# Patient Record
Sex: Male | Born: 1975
Health system: Southern US, Community
[De-identification: ages and names within clinical notes are randomized; demographics above are authoritative.]

## PROBLEM LIST (undated history)

## (undated) DIAGNOSIS — Z8739 Personal history of other diseases of the musculoskeletal system and connective tissue: Secondary | ICD-10-CM

## (undated) DIAGNOSIS — M109 Gout, unspecified: Secondary | ICD-10-CM

## (undated) DIAGNOSIS — K219 Gastro-esophageal reflux disease without esophagitis: Secondary | ICD-10-CM

## (undated) HISTORY — DX: Personal history of other diseases of the musculoskeletal system and connective tissue: Z87.39

---

## 2015-07-06 LAB — CBC AND DIFFERENTIAL
HCT: 43 % (ref 41–53)
Hemoglobin: 14.7 g/dL (ref 13.5–17.5)
Neutrophils Absolute: 3 /uL
Platelets: 269 10*3/uL (ref 150–399)
WBC: 5.7 10^3/mL

## 2015-07-06 LAB — PSA: PROSTATE SPECIFIC AG, SERUM: 0.5

## 2015-07-06 LAB — BASIC METABOLIC PANEL
BUN: 13 mg/dL (ref 4–21)
CREATININE: 1.1 mg/dL (ref 0.6–1.3)
GLUCOSE: 84 mg/dL
POTASSIUM: 4.1 mmol/L (ref 3.4–5.3)
SODIUM: 138 mmol/L (ref 137–147)

## 2015-07-06 LAB — LIPID PANEL
CHOLESTEROL: 196 mg/dL (ref 0–200)
HDL: 52 mg/dL (ref 35–70)
LDL CALC: 123 mg/dL
TRIGLYCERIDES: 105 mg/dL (ref 40–160)

## 2015-07-06 LAB — HEPATIC FUNCTION PANEL
ALT: 92 U/L — AB (ref 10–40)
AST: 45 U/L — AB (ref 14–40)

## 2015-07-06 LAB — TSH: TSH: 3.59 u[IU]/mL (ref 0.41–5.90)

## 2015-08-02 DIAGNOSIS — J069 Acute upper respiratory infection, unspecified: Secondary | ICD-10-CM | POA: Diagnosis not present

## 2015-09-05 ENCOUNTER — Ambulatory Visit (INDEPENDENT_AMBULATORY_CARE_PROVIDER_SITE_OTHER): Payer: BLUE CROSS/BLUE SHIELD | Admitting: Family Medicine

## 2015-09-05 ENCOUNTER — Ambulatory Visit (INDEPENDENT_AMBULATORY_CARE_PROVIDER_SITE_OTHER): Payer: BLUE CROSS/BLUE SHIELD

## 2015-09-05 ENCOUNTER — Encounter: Payer: Self-pay | Admitting: Family Medicine

## 2015-09-05 VITALS — BP 119/81 | HR 84 | Temp 98.8°F | Ht 75.0 in | Wt 236.0 lb

## 2015-09-05 DIAGNOSIS — Z8781 Personal history of (healed) traumatic fracture: Secondary | ICD-10-CM | POA: Insufficient documentation

## 2015-09-05 DIAGNOSIS — Z8739 Personal history of other diseases of the musculoskeletal system and connective tissue: Secondary | ICD-10-CM | POA: Insufficient documentation

## 2015-09-05 DIAGNOSIS — R05 Cough: Secondary | ICD-10-CM

## 2015-09-05 DIAGNOSIS — R053 Chronic cough: Secondary | ICD-10-CM | POA: Insufficient documentation

## 2015-09-05 DIAGNOSIS — R740 Nonspecific elevation of levels of transaminase and lactic acid dehydrogenase [LDH]: Secondary | ICD-10-CM

## 2015-09-05 DIAGNOSIS — R7401 Elevation of levels of liver transaminase levels: Secondary | ICD-10-CM | POA: Insufficient documentation

## 2015-09-05 DIAGNOSIS — J309 Allergic rhinitis, unspecified: Secondary | ICD-10-CM | POA: Insufficient documentation

## 2015-09-05 MED ORDER — OMEPRAZOLE 40 MG PO CPDR: 40.0000 mg | DELAYED_RELEASE_CAPSULE | Freq: Every day | ORAL | Status: DC

## 2015-09-05 NOTE — Progress Notes (Signed)
       William Marshall is a 40 y.o. male who [facility] today for establish care and discuss chronic cough. Patient has a several year history of chronic bothersome cough. He's been treated presumptively for postnasal drainage and silent acid reflux and allergies all of which really has never helped much. He currently takes Zyrtec daily. He denies any fevers chills vomiting or diarrhea. The cough is bothersome persistent and nonproductive.  Patient notes he had labs done at work revealing mild transaminitis in April 2017.   Past Medical History  Diagnosis Date  . History of osteomyelitis as a child    History reviewed. No pertinent past surgical history. Social History  Substance Use Topics  . Smoking status: Never Smoker   . Smokeless tobacco: Not on file  . Alcohol Use: 0.0 oz/week    0 Standard drinks or equivalent per week   family history includes Diabetes in his mother; Hypertension in his mother; Kidney disease in his maternal grandmother.  ROS as above: No headache, visual changes, nausea, vomiting, diarrhea, constipation, dizziness, abdominal pain, skin rash, fevers, chills, night sweats, weight loss, swollen lymph nodes, body aches, joint swelling, muscle aches, chest pain, shortness of breath, mood changes, visual or auditory hallucinations.   Medications: Current Outpatient Prescriptions  Medication Sig Dispense Refill  . omeprazole (PRILOSEC) 40 MG capsule Take 1 capsule (40 mg total) by mouth daily. 30 capsule 3   No current facility-administered medications for this visit.   No Known Allergies   Exam:  BP 119/81 mmHg  Pulse 84  Temp(Src) 98.8 F (37.1 C) (Oral)  Ht 6\' 3"  (1.905 m)  Wt 236 lb (107.049 kg)  BMI 29.50 kg/m2  SpO2 97% Gen: Well NAD HEENT: EOMI,  MMM Posterior pharynx with cobblestoning Lungs: Normal work of breathing.  Coarse breath sounds with prolonged expiratory phase and some wheezing Heart: RRR no MRG Abd: NABS, Soft. Nondistended, Nontender Exts: Brisk capillary refill, warm and well perfused.   No results found for this or any previous visit (from the past 24 hour(s)). No results found.    Assessment and Plan: 40 y.o. male with  1) chronic cough: Unclear etiology. I'm quite concerned for asthma versus acid reflux. Plan to obtain spirometry/lung function testing in the near future. Additionally we'll treat empirically with omeprazole. Obtain chest x-ray. 2) transaminitis: Will repeat metabolic panel today.  Discussed warning signs or symptoms. Please see discharge instructions. Patient expresses understanding.

## 2015-09-05 NOTE — Patient Instructions (Signed)
Thank you for coming in today. Get labs today.  Get xray today.  Start omeprazole today as well. Take it daily for at least 1 month.   Schedule a lung function test (spirometry) with me soon.   Fatty Liver Fatty liver, also called hepatic steatosis or steatohepatitis, is a condition in which too much fat has built up in your liver cells. The liver removes harmful substances from your bloodstream. It produces fluids your body needs. It also helps your body use and store energy from the food you eat. In many cases, fatty liver does not cause symptoms or problems. It is often diagnosed when tests are being done for other reasons. However, over time, fatty liver can cause inflammation that may lead to more serious liver problems, such as scarring of the liver (cirrhosis). CAUSES  Causes of fatty liver may include:   Drinking too much alcohol.  Poor nutrition.  Obesity.  Cushing syndrome.  Diabetes.  Hyperlipidemia.  Pregnancy.  Certain drugs.  Poisons.  Some viral infections. RISK FACTORS You may be more likely to develop fatty liver if you:  Abuse alcohol.  Are pregnant.  Are overweight.  Have diabetes.  Have hepatitis.  Have a high triglyceride level.  SIGNS AND SYMPTOMS  Fatty liver often does not cause any symptoms. In cases where symptoms develop, they can include:  Fatigue.  Weakness.  Weight loss.  Confusion.   Abdominal pain.  Yellowing of your skin and the white parts of your eyes (jaundice).  Nausea and vomiting. DIAGNOSIS  Fatty liver may be diagnosed by:   Physical exam and medical history.  Blood tests.  Imaging tests, such as an ultrasound, CT scan, or MRI.  Liver biopsy. A small sample of liver tissue is removed using a needle. The sample is then looked at under a microscope. TREATMENT  Fatty liver is often caused by other health conditions. Treatment for fatty liver may involve medicines and lifestyle changes to manage conditions  such as:   Alcoholism.  High cholesterol.  Diabetes.  Being overweight or obese.  HOME CARE INSTRUCTIONS  Eat a healthy diet as directed by your health care provider.  Exercise regularly. This can help you lose weight and control your cholesterol and diabetes. Talk to your health care provider about an exercise plan and which activities are best for you.  Do not drink alcohol.   Take medicines only as directed by your health care provider. SEEK MEDICAL CARE IF: You have difficulty controlling your:  Blood sugar.  Cholesterol.  Alcohol consumption. SEEK IMMEDIATE MEDICAL CARE IF:  You have abdominal pain.  You have jaundice.  You have nausea and vomiting.   This information is not intended to replace advice given to you by your health care provider. Make sure you discuss any questions you have with your health care provider.   Document Released: 04/12/2005 Document Revised: 03/18/2014 Document Reviewed: 07/07/2013 Elsevier Interactive Patient Education [employer] Elsevier Inc.

## 2015-09-05 NOTE — Addendum Note (Signed)
Addended by: Minna AntisBRIGHAM, EBONY T on: 09/05/2015 05:10 PM   Modules accepted: Orders

## 2015-09-06 ENCOUNTER — Encounter: Payer: Self-pay | Admitting: Family Medicine

## 2015-09-06 LAB — COMPREHENSIVE METABOLIC PANEL
ALK PHOS: 68 U/L (ref 40–115)
ALT: 97 U/L — AB (ref 9–46)
AST: 55 U/L — ABNORMAL HIGH (ref 10–40)
Albumin: 4.7 g/dL (ref 3.6–5.1)
BILIRUBIN TOTAL: 1.3 mg/dL — AB (ref 0.2–1.2)
BUN: 12 mg/dL (ref 7–25)
CALCIUM: 9.4 mg/dL (ref 8.6–10.3)
CO2: 27 mmol/L (ref 20–31)
CREATININE: 1.19 mg/dL (ref 0.60–1.35)
Chloride: 103 mmol/L (ref 98–110)
GLUCOSE: 89 mg/dL (ref 65–99)
Potassium: 3.9 mmol/L (ref 3.5–5.3)
Sodium: 141 mmol/L (ref 135–146)
TOTAL PROTEIN: 7.2 g/dL (ref 6.1–8.1)

## 2015-09-06 NOTE — Progress Notes (Signed)
Quick Note:  Lung xray normal. ______

## 2015-09-06 NOTE — Progress Notes (Signed)
Quick Note:  The liver enzymes are still elevated. We will get some labs to test for viral hepatitis and get a liver ultrasound that will help us figure out why this is the case.   ______

## 2015-09-06 NOTE — Addendum Note (Signed)
Addended by: Rodolph BongOREY, Caitlynne Harbeck S on: 09/06/2015 07:32 AM   Modules accepted: Orders

## 2015-09-11 ENCOUNTER — Ambulatory Visit (INDEPENDENT_AMBULATORY_CARE_PROVIDER_SITE_OTHER): Payer: BLUE CROSS/BLUE SHIELD

## 2015-09-11 DIAGNOSIS — R7401 Elevation of levels of liver transaminase levels: Secondary | ICD-10-CM

## 2015-09-11 DIAGNOSIS — R740 Nonspecific elevation of levels of transaminase and lactic acid dehydrogenase [LDH]: Secondary | ICD-10-CM

## 2015-09-11 NOTE — Progress Notes (Signed)
Quick Note:  The ultrasound shows fatty liver disease like we suspected. Work on diet and weight loss. ______

## 2015-09-12 LAB — HEPATITIS C ANTIBODY: HCV AB: NEGATIVE

## 2015-09-12 LAB — HEPATITIS B CORE ANTIBODY, TOTAL: HEP B C TOTAL AB: NONREACTIVE

## 2015-09-12 LAB — HEPATITIS B SURFACE ANTIBODY, QUANTITATIVE: HEPATITIS B-POST: 121 m[IU]/mL

## 2015-09-12 LAB — HEPATITIS B SURFACE ANTIGEN: Hepatitis B Surface Ag: NEGATIVE

## 2015-09-22 ENCOUNTER — Ambulatory Visit (INDEPENDENT_AMBULATORY_CARE_PROVIDER_SITE_OTHER): Payer: BLUE CROSS/BLUE SHIELD | Admitting: Family Medicine

## 2015-09-22 VITALS — BP 141/84 | HR 55 | Wt 242.0 lb

## 2015-09-22 DIAGNOSIS — R05 Cough: Secondary | ICD-10-CM

## 2015-09-22 DIAGNOSIS — R053 Chronic cough: Secondary | ICD-10-CM

## 2015-09-22 MED ORDER — ALBUTEROL SULFATE (2.5 MG/3ML) 0.083% IN NEBU
2.5000 mg | INHALATION_SOLUTION | Freq: Once | RESPIRATORY_TRACT | Status: AC
  Administered 2015-09-22: 2.5 mg via RESPIRATORY_TRACT

## 2015-09-22 NOTE — Progress Notes (Signed)
       Caryl BisSpencer Walther is a 40 y.o. male who [name] today for follow-up cough. She notes chronic cough. He was seen recently and started on omeprazole for possible reflux. He notes this is helped a bit. Additionally he was recommended to restart nasal steroids. He's use these very inconsistently. He denies significant wheezing or shortness of breath. He was seen today for spirometry as well.   Past Medical History  Diagnosis Date  . History of osteomyelitis as a child    No past surgical history on file. Social History  Substance Use Topics  . Smoking status: Never Smoker   . Smokeless tobacco: Not on file  . Alcohol Use: 0.0 oz/week    0 Standard drinks or equivalent per week   family history includes Diabetes in his mother; Hypertension in his mother; Kidney disease in his maternal grandmother.  ROS as above:  Medications: Current Outpatient Prescriptions  Medication Sig Dispense Refill  . omeprazole (PRILOSEC) 40 MG capsule Take 1 capsule (40 mg total) by mouth daily. 30 capsule 3   No current facility-administered medications for this visit.   No Known Allergies   Exam:  BP 141/84 mmHg  Pulse 55  Wt 242 lb (109.77 kg) Gen: Well NAD  PFT INTERPRETATION  FEV1/FVC >70 *AND*  FEV1 >80% predicted  = NORMAL SPIROMETRY NORMAL? yes  1. Valid study? yes 2. Flow-Volume Loop: normal 3. FEV1/FVC: 95% (normal)  >70 = Normal  <70 = Obstructive    = COPD no 3. Severity of Obstruction ~ Post-Bronchodilator FEV1: NA  FEV1 80+ = GOLD Stage I = Mild  FEV1 50-79 = GOLD Stage II = Moderate  FEV1 30-49 = GOLD Stage III = Severe  FEV1 <30 = GOLD Stage IV = Very Severe 5. Bronchodilator Challenge  Increase FEV1 or FVC by 200+mL? no *AND*  Increased same FEV1 or FVC by 12+%? no  Reversible? no 6. FVC <80% = Restrictive no 7. Lung Volumes  TLC, VC,  RV Normal = 80-120%  TLC <80% = Restrictive  TLC >120% = Hyperinflation  RV >120% = Air Trapping  RV <80% = RLD or OLD  VC <80% = RLD or OLD 8. Diffusion Capacity - refer to Pulm needed? No If suspect interstitial lung disease           No results found for this or any previous visit (from the past 24 hour(s)). No results found.    Assessment and Plan: 40 y.o. male with chronic cough with mildly abnormal but within normal range spirometry results.  I suspect the cause of his chronic cough is more related to silent reflux and postnasal drainage then a pulmonology etiology.  Plan to continue Flonase nasal spray, refer to ear nose and throat and return in a few months. If not better will refer to pulmonology for further testing and evaluation.  Discussed warning signs or symptoms. Please see discharge instructions. Patient expresses understanding.

## 2015-09-22 NOTE — Patient Instructions (Signed)
Thank you for coming in today. Use flonase.  Follow up with ENT.  Return in a few months if not better.  Call or go to the emergency room if you get worse, have trouble breathing, have chest pains, or palpitations.

## 2017-03-17 ENCOUNTER — Encounter: Payer: Self-pay | Admitting: Family Medicine

## 2017-03-17 ENCOUNTER — Ambulatory Visit: Payer: BLUE CROSS/BLUE SHIELD | Admitting: Family Medicine

## 2017-03-17 VITALS — BP 150/91 | HR 79 | Ht 75.0 in | Wt 255.0 lb

## 2017-03-17 DIAGNOSIS — R05 Cough: Secondary | ICD-10-CM

## 2017-03-17 DIAGNOSIS — R059 Cough, unspecified: Secondary | ICD-10-CM

## 2017-03-17 DIAGNOSIS — Z23 Encounter for immunization: Secondary | ICD-10-CM | POA: Diagnosis not present

## 2017-03-17 MED ORDER — AZITHROMYCIN 250 MG PO TABS: 250.0000 mg | ORAL_TABLET | Freq: Every day | ORAL | 0 refills | Status: DC

## 2017-03-17 MED ORDER — PREDNISONE 10 MG PO TABS: 30.0000 mg | ORAL_TABLET | Freq: Every day | ORAL | 0 refills | Status: DC

## 2017-03-17 NOTE — Progress Notes (Signed)
William Marshall is a 42 y.o. male who [name] today for sore neck and congestion.  Patient with 4 week history of cough, nasal running, sore throat, and green mucous production. Patient reports chronic cough at baseline, but it has been worse the last 4 weeks. Patient denies fever, chills, shortness of breath. URI symptoms have improved somewhat, but he still endorses productive cough and congestion. Patient now endorsing sore left ear and mandibular region. He states that this began 3-4 days ago. Patient not endorsing any pain with chewing, shaving, and biting down. Patient does endorse some ear fullness/congestion. Again, denies fever and chills.   Otherwise, patient doing well. No new complaints today.    Past Medical History:  Diagnosis Date  . History of osteomyelitis as a child    No past surgical history on file. Social History   Tobacco Use  . Smoking status: Never Smoker  . Smokeless tobacco: Never Used  Substance Use Topics  . Alcohol use: Yes    Alcohol/week: 0.0 oz   family history includes Diabetes in his mother; Hypertension in his mother; Kidney disease in his maternal grandmother.  ROS as above:  Medications: Current Outpatient Medications  Medication Sig Dispense Refill  . omeprazole (PRILOSEC) 40 MG capsule Take 1 capsule (40 mg total) by mouth daily. 30 capsule 3  . azithromycin (ZITHROMAX) 250 MG tablet Take 1 tablet (250 mg total) by mouth daily. Take first 2 tablets together, then 1 every day until finished. 6 tablet 0  . predniSONE (DELTASONE) 10 MG tablet Take 3 tablets (30 mg total) by mouth daily with breakfast. 15 tablet 0   No current facility-administered medications for this visit.    No Known Allergies  Health Maintenance Health Maintenance  Topic Date Due  . HIV Screening  03/13/1988  . TETANUS/TDAP   03/18/2027  . INFLUENZA VACCINE  Completed   Exam:  BP (!) 150/91   Pulse 79   Ht 6\' 3"  (1.905 m)   Wt 255 lb (115.7 kg)   BMI 31.87 kg/m  Gen: Well NAD HEENT: EOMI,  MMM. No cervical lymphadenopathy in posterior/anterior/mandibular chains. TMs clear bilaterally. Clear nasal discharge Lungs: Normal work of breathing. CTABL. No crackles at bases. No wheezes.  Heart: RRR no MRG Abd: NABS, Soft. Nondistended, Nontender Exts: Brisk capillary refill, warm and well perfused.    No results found for this or any previous visit (from the past 72 hour(s)). No results found.    Assessment and Plan: 42 y.o. male with ongoing rhinosinusitis vs bronchitis. Patients presentation was originally most consistent with viral URI. However, congestion and mucous production as well as productive cough have been present for 4 weeks at this point. Patient does not appear to be improving.  It is likely that patient will benefit from steroid course at this point. Will prescribe back of azithromycin if patient acutely worsens or develops fever.   Tdap and Flu vaccine given.   Orders Placed This Encounter  Procedures  . Tdap vaccine greater than or equal to 7yo IM  . Flu Vaccine QUAD 36+ mos IM    [facility]   Meds ordered this encounter  Medications  . predniSONE (DELTASONE) 10 MG tablet    Sig: Take 3 tablets (30 mg total) by mouth daily with breakfast.    Dispense:  15 tablet    Refill:  0  . azithromycin (ZITHROMAX) 250 MG tablet    Sig:  Take 1 tablet (250 mg total) by mouth daily. Take first 2 tablets together, then 1 every day until finished.    Dispense:  6 tablet    Refill:  0     Discussed warning signs or symptoms. Please see discharge instructions. Patient expresses understanding.

## 2017-03-17 NOTE — Patient Instructions (Signed)
Thank you for coming in today.  Take prednisone Take azithromycin if not better. \  We will update labs.   If not better let me know.  Call or go to the emergency room if you get worse, have trouble breathing, have chest pains, or palpitations.

## 2017-07-02 LAB — LIPID PANEL
Cholesterol: 204 — AB (ref 0–200)
HDL: 55 (ref 35–70)
LDL CALC: 124
LDl/HDL Ratio: 2.3
Triglycerides: 125 (ref 40–160)

## 2017-07-02 LAB — BASIC METABOLIC PANEL
BUN: 14 (ref 4–21)
CREATININE: 1.1 (ref 0.6–1.3)
Glucose: 92
Potassium: 4.4 (ref 3.4–5.3)
Sodium: 141 (ref 137–147)

## 2017-07-02 LAB — TSH: TSH: 2.61 (ref 0.41–5.90)

## 2017-07-02 LAB — CBC AND DIFFERENTIAL: HEMOGLOBIN: 14.6 (ref 13.5–17.5)

## 2017-07-02 LAB — HEPATIC FUNCTION PANEL
ALK PHOS: 75 (ref 25–125)
ALT: 68 — AB (ref 10–40)
AST: 40 (ref 14–40)
BILIRUBIN, TOTAL: 0.7

## 2017-07-02 LAB — PSA: PSA: 0.7

## 2017-08-12 ENCOUNTER — Ambulatory Visit: Payer: BLUE CROSS/BLUE SHIELD | Admitting: Family Medicine

## 2017-08-12 ENCOUNTER — Encounter: Payer: Self-pay | Admitting: Family Medicine

## 2017-08-12 VITALS — BP 133/82 | HR 78 | Wt 253.0 lb

## 2017-08-12 DIAGNOSIS — B360 Pityriasis versicolor: Secondary | ICD-10-CM | POA: Diagnosis not present

## 2017-08-12 MED ORDER — FLUCONAZOLE 150 MG PO TABS: 300.0000 mg | ORAL_TABLET | ORAL | 1 refills | Status: DC

## 2017-08-12 NOTE — Progress Notes (Signed)
       William Marshall is a 42 y.o. male who [name] today for skin rash.  Patient reports that for the past two months he has noted some hypopigmented spots on his neck, face, arms, and legs. He works outside daily and is exposed to a lot of plants that often give him rashes. He denies any symptoms from these spots and is only mildly annoyed by the cosmetic appearance. He has not treated it with anything.  Symptoms present for a few weeks to a month.  No new soaps detergents or cosmetics.  No new medications.  He does not use sunscreen regularly.   ROS as above:  Exam:  BP 133/82   Pulse 78   Wt 253 lb (114.8 kg)   BMI 31.62 kg/m  Gen: Well NAD HEENT: EOMI,  MMM Lungs: Normal work of breathing. CTABL Heart: RRR no MRG Abd: NABS, Soft. Nondistended, Nontender Exts: Brisk capillary refill, warm and well perfused.   Skin: Multitude of 5 mm - 2 cm hypopigmented patches on his neck, arms, and legs. No discharge or flaking.  No mucocutaneous involvement.  Lab and Radiology Results No results found for this or any previous visit (from the past 72 hour(s)). No results found.   Assessment and Plan: 42 y.o. male with   Tinea versicolor: Patients skin lesions are likely tinea versicolor due to their scattered appearance on sun exposed spots of his body. Vitiligo is also on the differential but this is less likely and usually does not present in this pattern.  Treatment with fluconazole 300 mg once weekly for 2 weeks.  Recheck in about 2 months.  Return sooner if needed.   No orders of the defined types were placed in this encounter.  Meds ordered this encounter  Medications  . fluconazole (DIFLUCAN) 150 MG tablet    Sig: Take 2 tablets (300 mg total) by mouth once a week.    Dispense:  4 tablet    Refill:  1     Historical information moved to improve  visibility of documentation.  Past Medical History:  Diagnosis Date  . History of osteomyelitis as a child    No past surgical history on file. Social History   Tobacco Use  . Smoking status: Never Smoker  . Smokeless tobacco: Never Used  Substance Use Topics  . Alcohol use: Yes    Alcohol/week: 0.0 oz   family history includes Diabetes in his mother; Hypertension in his mother; Kidney disease in his maternal grandmother.  Medications: Current Outpatient Medications  Medication Sig Dispense Refill  . omeprazole (PRILOSEC) 40 MG capsule Take 1 capsule (40 mg total) by mouth daily. 30 capsule 3  . fluconazole (DIFLUCAN) 150 MG tablet Take 2 tablets (300 mg total) by mouth once a week. 4 tablet 1   No current facility-administered medications for this visit.    No Known Allergies  Health Maintenance Health Maintenance  Topic Date Due  . HIV Screening  03/13/1988  . INFLUENZA VACCINE  10/09/2017  . TETANUS/TDAP  03/18/2027    Discussed warning signs or symptoms. Please see discharge instructions. Patient expresses understanding.

## 2017-08-12 NOTE — Patient Instructions (Signed)
Thank you for coming in today.  I think this is Tinea Versicolor.  Take fluconazole 2 pills weekly for 2 weeks.  Recheck in 6-8 weeks.  Return sooner if needed.  Use sun screen reliably.   Tinea Versicolor Tinea versicolor is a common fungal infection of the skin. It causes a rash that appears as light or dark patches on the skin. The rash most often occurs on the chest, back, neck, or upper arms. This condition is more common during warm weather. Other than affecting how your skin looks, tinea versicolor usually does not cause other problems. In most cases, the infection goes away in a few weeks with treatment. It may take a few months for the patches on your skin to clear up. What are the causes? Tinea versicolor occurs when a type of fungus that is normally present on the skin starts to overgrow. This fungus is a kind of yeast. The exact cause of the overgrowth is not known. This condition cannot be passed from one person to another (noncontagious). What increases the risk? This condition is more likely to develop when certain factors are present, such as:  Heat and humidity.  Sweating too much.  Hormone changes.  Oily skin.  A weak defense (immune) system.  What are the signs or symptoms? Symptoms of this condition may include:  A rash on your skin that is made up of light or dark patches. The rash may have: ? Patches of tan or pink spots on light skin. ? Patches of white or brown spots on dark skin. ? Patches of skin that do not tan. ? Well-marked edges. ? Scales on the discolored areas.  Mild itching.  How is this diagnosed? A health care provider can usually diagnose this condition by looking at your skin. During the exam, he or she may use ultraviolet light to help determine the extent of the infection. In some cases, a skin sample may be taken by scraping the rash. This sample will be viewed under a microscope to check for yeast overgrowth. How is this  treated? Treatment for this condition may include:  Dandruff shampoo that is applied to the affected skin during showers or bathing.  Over-the-counter medicated skin cream, lotion, or soaps.  Prescription antifungal medicine in the form of skin cream or pills.  Medicine to help reduce itching.  Follow these instructions at home:  Take medicines only as directed by your health care provider.  Apply dandruff shampoo to the affected area if told to do so by your health care provider. You may be instructed to scrub the affected skin for several minutes each day.  Do not scratch the affected area of skin.  Avoid hot and humid conditions.  Do not use tanning booths.  Try to avoid sweating a lot. Contact a health care provider if:  Your symptoms get worse.  You have a fever.  You have redness, swelling, or pain at the site of your rash.  You have fluid, blood, or pus coming from your rash.  Your rash returns after treatment. This information is not intended to replace advice given to you by your health care provider. Make sure you discuss any questions you have with your health care provider. Document Released: 02/23/2000 Document Revised: 10/29/2015 Document Reviewed: 12/07/2013 Elsevier Interactive Patient Education  2018 [employer]

## 2017-09-18 ENCOUNTER — Encounter: Payer: Self-pay | Admitting: Family Medicine

## 2017-09-18 ENCOUNTER — Ambulatory Visit: Payer: BLUE CROSS/BLUE SHIELD | Admitting: Family Medicine

## 2017-09-18 VITALS — BP 141/75 | HR 90 | Ht 75.0 in | Wt 253.0 lb

## 2017-09-18 DIAGNOSIS — B360 Pityriasis versicolor: Secondary | ICD-10-CM

## 2017-09-18 DIAGNOSIS — L237 Allergic contact dermatitis due to plants, except food: Secondary | ICD-10-CM | POA: Diagnosis not present

## 2017-09-18 MED ORDER — HYDROXYZINE HCL 50 MG PO TABS: 50.0000 mg | ORAL_TABLET | Freq: Three times a day (TID) | ORAL | 3 refills | Status: DC | PRN

## 2017-09-18 MED ORDER — TRIAMCINOLONE ACETONIDE 0.5 % EX CREA: 1.0000 "application " | TOPICAL_CREAM | Freq: Two times a day (BID) | CUTANEOUS | 3 refills | Status: DC

## 2017-09-18 MED ORDER — PREDNISONE 5 MG (48) PO TBPK: ORAL_TABLET | ORAL | 0 refills | Status: DC

## 2017-09-18 NOTE — Patient Instructions (Signed)
Thank you for coming in today. Take hydrazine at bed time as needed for itching. Use triamcinolone cream on the rash 2-3x daily for rash itching. If worsening fill and take the prednisone course.   Return sooner if needed.    Poison Ivy Dermatitis Poison ivy dermatitis is inflammation of the skin that is caused by the allergens on the leaves of the poison ivy plant. The skin reaction often involves redness, swelling, blisters, and extreme itching. What are the causes? This condition is caused by a specific chemical (urushiol) found in the sap of the poison ivy plant. This chemical is sticky and can be easily spread to people, animals, and objects. You can get poison ivy dermatitis by:  Having direct contact with a poison ivy plant.  Touching animals, other people, or objects that have come in contact with poison ivy and have the chemical on them.  What increases the risk? This condition is more likely to develop in:  People who are outdoors often.  People who go outdoors without wearing protective clothing, such as closed shoes, long pants, and a long-sleeved shirt.  What are the signs or symptoms? Symptoms of this condition include:  Redness and itching.  A rash that often includes bumps and blisters. The rash usually appears 48 hours after exposure.  Swelling. This may occur if the reaction is more severe.  Symptoms usually last for 1-2 weeks. However, the first time you develop this condition, symptoms may last 3-4 weeks. How is this diagnosed? This condition may be diagnosed based on your symptoms and a physical exam. Your health care provider may also ask you about any recent outdoor activity. How is this treated? Treatment for this condition will vary depending on how severe it is. Treatment may include:  Hydrocortisone creams or calamine lotions to relieve itching.  Oatmeal baths to soothe the skin.  Over-the-counter antihistamine tablets.  Oral steroid medicine  for more severe outbreaks.  Follow these instructions at home:  Take or apply over-the-counter and prescription medicines only as told by your health care provider.  Wash exposed skin as soon as possible with soap and cold water.  Use hydrocortisone creams or calamine lotion as needed to soothe the skin and relieve itching.  Take oatmeal baths as needed. Use colloidal oatmeal. You can get this at your local pharmacy or grocery store. Follow the instructions on the packaging.  Do not scratch or rub your skin.  While you have the rash, wash clothes right after you wear them. How is this prevented?  Learn to identify the poison ivy plant and avoid contact with the plant. This plant can be recognized by the number of leaves. Generally, poison ivy has three leaves with flowering branches on a single stem. The leaves are typically glossy, and they have jagged edges that come to a point at the front.  If you have been exposed to poison ivy, thoroughly wash with soap and water right away. You have about 30 minutes to remove the plant resin before it will cause the rash. Be sure to wash under your fingernails because any plant resin there will continue to spread the rash.  When hiking or camping, wear clothes that will help you to avoid exposure on the skin. This includes long pants, a long-sleeved shirt, tall socks, and hiking boots. You can also apply preventive lotion to your skin to help limit exposure.  If you suspect that your clothes or outdoor gear came in contact with poison ivy, rinse them  off outside with a garden hose before you bring them inside your house. Contact a health care provider if:  You have open sores in the rash area.  You have more redness, swelling, or pain in the affected area.  You have redness that spreads beyond the rash area.  You have fluid, blood, or pus coming from the affected area.  You have a fever.  You have a rash over a large area of your  body.  You have a rash on your eyes, mouth, or genitals.  Your rash does not improve after a few days. Get help right away if:  Your face swells or your eyes swell shut.  You have trouble breathing.  You have trouble swallowing. This information is not intended to replace advice given to you by your health care provider. Make sure you discuss any questions you have with your health care provider. Document Released: 02/23/2000 Document Revised: 08/03/2015 Document Reviewed: 08/03/2014 Elsevier Interactive Patient Education  [employer].

## 2017-09-18 NOTE — Progress Notes (Signed)
William Marshall is a 42 y.o. male who [name] today for poison ivy dermatitis.  William Marshall was exposed to poison ivy landscaping about 2 days ago.  He notes a very itchy rash on his arms and legs bilaterally.  He feels well with no fevers or chills nausea vomiting or diarrhea.  He is tried calamine and Benadryl which helped a little.  He feels well otherwise.  Additionally he is here to follow-up his Tinea Versicolor.  He was seen about 2 months ago for a hypopigmented rash on his neck and face which has significantly improved following oral antifungals.  He still has some areas of hypopigmentation however they are decreasing significantly.   ROS as above:  Exam:  BP (!) 141/75   Pulse 90   Ht 6\' 3"  (1.905 m)   Wt 253 lb (114.8 kg)   BMI 31.62 kg/m  Gen: Well NAD HEENT: EOMI,  MMM Lungs: Normal work of breathing. CTABL Heart: RRR no MRG Abd: NABS, Soft. Nondistended, Nontender Exts: Brisk capillary refill, warm and well perfused.  Skin: Erythematous maculopapular rash arms and legs bilaterally. Macular hypopigmentation areas at the left neck and face diminished.     Assessment and Plan: 42 y.o. male with  Poison ivy dermatitis: Treatment with topical triamcinolone cream.  Hydroxyzine as needed at bedtime for itching.  Additionally recommend [place] itch as needed.  Prednisone prescription printed.  Patient can fill and start taking the prednisone if his symptoms worsen or do not improve.  Tinea Versicolor: Significant improvement.  Recheck as needed.   No orders of the defined types were placed in this encounter.  Meds ordered this encounter  Medications  . triamcinolone cream (KENALOG) 0.5 %    Sig: Apply 1 application topically 2 (two) times daily. To affected areas.    Dispense:  60 g    Refill:  3  . predniSONE (STERAPRED UNI-PAK 48 TAB)  5 MG (48) TBPK tablet    Sig: 12 day dosepack po    Dispense:  48 tablet    Refill:  0  . hydrOXYzine (ATARAX/VISTARIL) 50 MG tablet    Sig: Take 1 tablet (50 mg total) by mouth every 8 (eight) hours as needed for itching.    Dispense:  30 tablet    Refill:  3     Historical information moved to improve visibility of documentation.  Past Medical History:  Diagnosis Date  . History of osteomyelitis as a child    No past surgical history on file. Social History   Tobacco Use  . Smoking status: Never Smoker  . Smokeless tobacco: Never Used  Substance Use Topics  . Alcohol use: Yes    Alcohol/week: 0.0 oz   family history includes Diabetes in his mother; Hypertension in his mother; Kidney disease in his maternal grandmother.  Medications: Current Outpatient Medications  Medication Sig Dispense Refill  . omeprazole (PRILOSEC) 40 MG capsule Take 1 capsule (40 mg total) by mouth daily. 30 capsule 3  . hydrOXYzine (ATARAX/VISTARIL) 50 MG tablet Take 1 tablet (50 mg total) by mouth every 8 (eight) hours as needed for itching. 30 tablet 3  . predniSONE (STERAPRED UNI-PAK 48 TAB) 5 MG (48) TBPK tablet 12 day dosepack po 48 tablet 0  . triamcinolone cream (KENALOG) 0.5 % Apply 1 application topically 2 (two) times daily. To affected areas. 60 g 3   No current facility-administered medications for this visit.  No Known Allergies   Discussed warning signs or symptoms. Please see discharge instructions. Patient expresses understanding.

## 2017-09-25 ENCOUNTER — Ambulatory Visit: Payer: BLUE CROSS/BLUE SHIELD | Admitting: Family Medicine

## 2017-11-14 ENCOUNTER — Encounter: Payer: Self-pay | Admitting: Family Medicine

## 2017-11-14 ENCOUNTER — Ambulatory Visit: Payer: BLUE CROSS/BLUE SHIELD | Admitting: Family Medicine

## 2017-11-14 VITALS — BP 135/81 | HR 64 | Temp 98.0°F | Wt 260.0 lb

## 2017-11-14 DIAGNOSIS — M109 Gout, unspecified: Secondary | ICD-10-CM

## 2017-11-14 DIAGNOSIS — Z23 Encounter for immunization: Secondary | ICD-10-CM

## 2017-11-14 DIAGNOSIS — Z114 Encounter for screening for human immunodeficiency virus [HIV]: Secondary | ICD-10-CM

## 2017-11-14 MED ORDER — COLCHICINE 0.6 MG PO CAPS: 1.0000 | ORAL_CAPSULE | Freq: Every day | ORAL | 2 refills | Status: DC

## 2017-11-14 MED ORDER — COLCHICINE 0.6 MG PO TABS: 0.6000 mg | ORAL_TABLET | Freq: Two times a day (BID) | ORAL | 2 refills | Status: DC

## 2017-11-14 MED ORDER — INDOMETHACIN 50 MG PO CAPS: 50.0000 mg | ORAL_CAPSULE | Freq: Three times a day (TID) | ORAL | 1 refills | Status: DC | PRN

## 2017-11-14 NOTE — Telephone Encounter (Signed)
Labs reviewed.  Creatinine 1.1 ALT elevated at 68  Lipids: TC 204 TG 125 HLD 55 LDL 124  PSA 0.7  Will be abstracted

## 2017-11-14 NOTE — Patient Instructions (Signed)
Thank you for coming in today. Get labs.  I will get results to you ASAP.  Take colochicine daily for 2 months.  Use indomethacin as needed.  I anticipate I will be starting uric acid lowering medicine allopurinol or Uloric.   Gout Gout is painful swelling that can occur in some of your joints. Gout is a type of arthritis. This condition is caused by having too much uric acid in your body. Uric acid is a chemical that forms when your body breaks down substances called purines. Purines are important for building body proteins. When your body has too much uric acid, sharp crystals can form and build up inside your joints. This causes pain and swelling. Gout attacks can happen quickly and be very painful (acute gout). Over time, the attacks can affect more joints and become more frequent (chronic gout). Gout can also cause uric acid to build up under your skin and inside your kidneys. What are the causes? This condition is caused by too much uric acid in your blood. This can occur because:  Your kidneys do not remove enough uric acid from your blood. This is the most common cause.  Your body makes too much uric acid. This can occur with some cancers and cancer treatments. It can also occur if your body is breaking down too many red blood cells (hemolytic anemia).  You eat too many foods that are high in purines. These foods include organ meats and some seafood. Alcohol, especially beer, is also high in purines.  A gout attack may be triggered by trauma or stress. What increases the risk? This condition is more likely to develop in people who:  Have a family history of gout.  Are male and middle-aged.  Are male and have gone through menopause.  Are obese.  Frequently drink alcohol, especially beer.  Are dehydrated.  Lose weight too quickly.  Have an organ transplant.  Have lead poisoning.  Take certain medicines, including aspirin, cyclosporine, diuretics, levodopa, and  niacin.  Have kidney disease or psoriasis.  What are the signs or symptoms? An attack of acute gout happens quickly. It usually occurs in just one joint. The most common place is the big toe. Attacks often start at night. Other joints that may be affected include joints of the feet, ankle, knee, fingers, wrist, or elbow. Symptoms may include:  Severe pain.  Warmth.  Swelling.  Stiffness.  Tenderness. The affected joint may be very painful to touch.  Shiny, red, or purple skin.  Chills and fever.  Chronic gout may cause symptoms more frequently. More joints may be involved. You may also have white or yellow lumps (tophi) on your hands or feet or in other areas near your joints. How is this diagnosed? This condition is diagnosed based on your symptoms, medical history, and physical exam. You may have tests, such as:  Blood tests to measure uric acid levels.  Removal of joint fluid with a needle (aspiration) to look for uric acid crystals.  X-rays to look for joint damage.  How is this treated? Treatment for this condition has two phases: treating an acute attack and preventing future attacks. Acute gout treatment may include medicines to reduce pain and swelling, including:  NSAIDs.  Steroids. These are strong anti-inflammatory medicines that can be taken by mouth (orally) or injected into a joint.  Colchicine. This medicine relieves pain and swelling when it is taken soon after an attack. It can be given orally or through an IV tube.  Preventive treatment may include:  Daily use of smaller doses of NSAIDs or colchicine.  Use of a medicine that reduces uric acid levels in your blood.  Changes to your diet. You may need to see a specialist about healthy eating (dietitian).  Follow these instructions at home: During a Gout Attack  If directed, apply ice to the affected area: ? Put ice in a plastic bag. ? Place a towel between your skin and the bag. ? Leave the ice on  for 20 minutes, 2-3 times a day.  Rest the joint as much as possible. If the affected joint is in your leg, you may be given crutches to use.  Raise (elevate) the affected joint above the level of your heart as often as possible.  Drink enough fluids to keep your urine clear or pale yellow.  Take over-the-counter and prescription medicines only as told by your health care provider.  Do not drive or operate heavy machinery while taking prescription pain medicine.  Follow instructions from your health care provider about eating or drinking restrictions.  Return to your normal activities as told by your health care provider. Ask your health care provider what activities are safe for you. Avoiding Future Gout Attacks  Follow a low-purine diet as told by your dietitian or health care provider. Avoid foods and drinks that are high in purines, including liver, kidney, anchovies, asparagus, herring, mushrooms, mussels, and beer.  Limit alcohol intake to no more than 1 drink a day for nonpregnant women and 2 drinks a day for men. One drink equals 12 oz of beer, 5 oz of wine, or 1 oz of hard liquor.  Maintain a healthy weight or lose weight if you are overweight. If you want to lose weight, talk with your health care provider. It is important that you do not lose weight too quickly.  Start or maintain an exercise program as told by your health care provider.  Drink enough fluids to keep your urine clear or pale yellow.  Take over-the-counter and prescription medicines only as told by your health care provider.  Keep all follow-up visits as told by your health care provider. This is important. Contact a health care provider if:  You have another gout attack.  You continue to have symptoms of a gout attack after10 days of treatment.  You have side effects from your medicines.  You have chills or a fever.  You have burning pain when you urinate.  You have pain in your lower back or  belly. Get help right away if:  You have severe or uncontrolled pain.  You cannot urinate. This information is not intended to replace advice given to you by your health care provider. Make sure you discuss any questions you have with your health care provider. Document Released: 02/23/2000 Document Revised: 08/03/2015 Document Reviewed: 12/08/2014 Elsevier Interactive Patient Education  2018 [employer].   Allopurinol tablets What is this medicine? ALLOPURINOL (al oh PURE i nole) reduces the amount of uric acid the body makes. It is used to treat the symptoms of gout. It is also used to treat or prevent high uric acid levels that occur as a result of certain types of chemotherapy. This medicine may also help patients who frequently have kidney stones. This medicine may be used for other purposes; ask your health care provider or pharmacist if you have questions. COMMON BRAND NAME(S): Zyloprim What should I tell my health care provider before I take this medicine? They need to know  if you have any of these conditions: -kidney or liver disease -an unusual or allergic reaction to allopurinol, other medicines, foods, dyes, or preservatives -pregnant or trying to get pregnant -breast feeding How should I use this medicine? Take this medicine by mouth with a glass of water. Follow the directions on the prescription label. If this medicine upsets your stomach, take it with food or milk. Take your doses at regular intervals. Do not take your medicine more often than directed. Talk to your pediatrician regarding the use of this medicine in children. Special care may be needed. While this drug may be prescribed for children as young as 6 years for selected conditions, precautions do apply. Overdosage: If you think you have taken too much of this medicine contact a poison control center or emergency room at once. NOTE: This medicine is only for you. Do not share this medicine with others. What if  I miss a dose? If you miss a dose, take it as soon as you can. If it is almost time for your next dose, take only that dose. Do not take double or extra doses. What may interact with this medicine? Do not take this medicine with the following medication: -didanosine, ddI This medicine may also interact with the following medications: -amoxicillin or ampicillin -azathioprine -certain medicines used to treat gout -certain types of diuretics -chlorpropamide -cyclosporine -dicumarol -mercaptopurine -tolbutamide -warfarin This list may not describe all possible interactions. Give your health care provider a list of all the medicines, herbs, non-prescription drugs, or dietary supplements you use. Also tell them if you smoke, drink alcohol, or use illegal drugs. Some items may interact with your medicine. What should I watch for while using this medicine? Visit your doctor or health care professional for regular checks on your progress. If you are taking this medicine to treat gout, you may not have less frequent attacks at first. Keep taking your medicine regularly and the attacks should get better within 2 to 6 weeks. Drink plenty of water (10 to 12 full glasses a day) while you are taking this medicine. This will help to reduce stomach upset and reduce the risk of getting gout or kidney stones. Call your doctor or health care professional at once if you get a skin rash together with chills, fever, sore throat, or nausea and vomiting, if you have blood in your urine, or difficulty passing urine. Do not take vitamin C without asking your doctor or health care professional. Too much vitamin C can increase the chance of getting kidney stones. You may get drowsy or dizzy. Do not drive, use machinery, or do anything that needs mental alertness until you know how this drug affects you. Do not stand or sit up quickly, especially if you are an older patient. This reduces the risk of dizzy or fainting spells.  Alcohol can make you more drowsy and dizzy. Alcohol can also increase the chance of stomach problems and increase the amount of uric acid in your blood. Avoid alcoholic drinks. What side effects may I notice from receiving this medicine? Side effects that you should report to your doctor or health care professional as soon as possible: -allergic reactions like skin rash, itching or hives, swelling of the face, lips, or tongue -breathing problems -muscle aches or pains -redness, blistering, peeling or loosening of the skin, including inside the mouth Side effects that usually do not require medical attention (report to your doctor or health care professional if they continue or are bothersome): -changes  in taste -diarrhea -indigestion -stomach pain or cramps This list may not describe all possible side effects. Call your doctor for medical advice about side effects. You may report side effects to FDA at 1-800-FDA-1088. Where should I keep my medicine? Keep out of the reach of children. Store at room temperature between 15 and 25 degrees C (59 and 77 degrees F). Protect from light and moisture. Throw away any unused medicine after the expiration date. NOTE: This sheet is a summary. It may not cover all possible information. If you have questions about this medicine, talk to your doctor, pharmacist, or health care provider.  2018 Elsevier/Gold Standard (2007-08-31 14:26:54)

## 2017-11-14 NOTE — Progress Notes (Signed)
William Marshall is a 42 y.o. male who presents to [facility]: Primary Care Sports Medicine today for gout.  Patient has a history of gout involving the great toe.  He was seen for this several years ago at his PCP and reportedly had a unremarkable uric acid level.  He is not really had a lot of problems until recently.  Over the last few weeks he is developed pain and swelling at his left first MTP.  He denies any injury or change in activity.  He notes pain is consistent with previous episodes of gout.  He is not currently eating a lower purine diet.  He took some of his friend's indomethacin which definitely has helped however he stopped taking the indomethacin the pain starts returning again.  The pain is starting to limit walking and ability to work normally at times.  He is a IT sales professional.  No fevers chills nausea vomiting or diarrhea.   ROS as above:  Exam:  BP 135/81   Pulse 64   Temp 98 F (36.7 C) (Oral)   Wt 260 lb (117.9 kg)   BMI 32.50 kg/m  Wt Readings from Last 5 Encounters:  11/14/17 260 lb (117.9 kg)  09/18/17 253 lb (114.8 kg)  08/12/17 253 lb (114.8 kg)  03/17/17 255 lb (115.7 kg)  09/22/15 242 lb (109.8 kg)    Gen: Well NAD HEENT: EOMI,  MMM Lungs: Normal work of breathing. CTABL Heart: RRR no MRG Abd: NABS, Soft. Nondistended, Nontender Exts: Brisk capillary refill, warm and well perfused.  Left foot slight swelling erythema at first MTP.  Pain with motion.  Otherwise foot and ankle are unremarkable appearing  Lab and Radiology Results   Chemistry      Component Value Date/Time   NA 141 09/05/2015 1648   NA 138 07/06/2015   K 3.9 09/05/2015 1648   CL 103 09/05/2015 1648   CO2 27 09/05/2015 1648   BUN 12 09/05/2015 1648   BUN 13 07/06/2015   CREATININE 1.19 09/05/2015 1648   GLU 84 07/06/2015      Component Value Date/Time   CALCIUM 9.4 09/05/2015 1648   ALKPHOS  68 09/05/2015 1648   AST 55 (H) 09/05/2015 1648   ALT 97 (H) 09/05/2015 1648   BILITOT 1.3 (H) 09/05/2015 1648        Assessment and Plan: 42 y.o. male with  Left 1st MTP pain likely gout flair.  Plan to check CMP, uric acid and CBC. Start colchicine ppx in anticipation of Uloric or allopurinol.  Assuming uric acid is elevated will start Uloric and use colchicine as prophylaxis for the first 1 to 2 months.  Additionally will prescribe indomethacin as a short-term use as needed treatment as he has had good response that the past.  Gout flare is a new issue to me with uncertain prognosis.  Differential also includes pseudogout or other arthralgias.  Additionally will administer influenza vaccine and check HIV as part of his routine health maintenance screening labs.  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  He plans on sending this to me in the near future.   Orders Placed This Encounter  Procedures  . Flu Vaccine QUAD 6+ mos PF IM (Fluarix Quad PF)  . CBC  . COMPLETE METABOLIC PANEL WITH GFR  . Uric acid  . HIV antibody   Meds ordered this encounter  Medications  . colchicine 0.6 MG tablet  Sig: Take 1 tablet (0.6 mg total) by mouth 2 (two) times daily.    Dispense:  30 tablet    Refill:  2  . indomethacin (INDOCIN) 50 MG capsule    Sig: Take 1 capsule (50 mg total) by mouth 3 (three) times daily as needed (gout).    Dispense:  30 capsule    Refill:  1     Historical information moved to improve visibility of documentation.  Past Medical History:  Diagnosis Date  . History of osteomyelitis as a child    History reviewed. No pertinent surgical history. Social History   Tobacco Use  . Smoking status: Never Smoker  . Smokeless tobacco: Never Used  Substance Use Topics  . Alcohol use: Yes    Alcohol/week: 0.0 standard drinks   family history includes Diabetes in his mother; Hypertension in his mother; Kidney disease in his  maternal grandmother.  Medications: Current Outpatient Medications  Medication Sig Dispense Refill  . omeprazole (PRILOSEC) 40 MG capsule Take 1 capsule (40 mg total) by mouth daily. 30 capsule 3  . colchicine 0.6 MG tablet Take 1 tablet (0.6 mg total) by mouth 2 (two) times daily. 30 tablet 2  . indomethacin (INDOCIN) 50 MG capsule Take 1 capsule (50 mg total) by mouth 3 (three) times daily as needed (gout). 30 capsule 1   No current facility-administered medications for this visit.    No Known Allergies   Discussed warning signs or symptoms. Please see discharge instructions. Patient expresses understanding.

## 2017-11-15 LAB — CBC
HCT: 41.8 % (ref 38.5–50.0)
HEMOGLOBIN: 14.2 g/dL (ref 13.2–17.1)
MCH: 31.8 pg (ref 27.0–33.0)
MCHC: 34 g/dL (ref 32.0–36.0)
MCV: 93.5 fL (ref 80.0–100.0)
MPV: 10.7 fL (ref 7.5–12.5)
Platelets: 224 10*3/uL (ref 140–400)
RBC: 4.47 10*6/uL (ref 4.20–5.80)
RDW: 12.9 % (ref 11.0–15.0)
WBC: 7.6 10*3/uL (ref 3.8–10.8)

## 2017-11-15 LAB — COMPLETE METABOLIC PANEL WITH GFR
AG Ratio: 1.7 (calc) (ref 1.0–2.5)
ALBUMIN MSPROF: 4.1 g/dL (ref 3.6–5.1)
ALT: 82 U/L — ABNORMAL HIGH (ref 9–46)
AST: 43 U/L — ABNORMAL HIGH (ref 10–40)
Alkaline phosphatase (APISO): 61 U/L (ref 40–115)
BUN: 13 mg/dL (ref 7–25)
CALCIUM: 9.2 mg/dL (ref 8.6–10.3)
CO2: 28 mmol/L (ref 20–32)
CREATININE: 1.08 mg/dL (ref 0.60–1.35)
Chloride: 104 mmol/L (ref 98–110)
GFR, EST AFRICAN AMERICAN: 96 mL/min/{1.73_m2} (ref >=60)
GFR, EST NON AFRICAN AMERICAN: 83 mL/min/{1.73_m2} (ref >=60)
GLUCOSE: 89 mg/dL (ref 65–99)
Globulin: 2.4 g/dL (calc) (ref 1.9–3.7)
Potassium: 4.3 mmol/L (ref 3.5–5.3)
Sodium: 139 mmol/L (ref 135–146)
TOTAL PROTEIN: 6.5 g/dL (ref 6.1–8.1)
Total Bilirubin: 0.5 mg/dL (ref 0.2–1.2)

## 2017-11-15 LAB — HIV ANTIBODY (ROUTINE TESTING W REFLEX): HIV: NONREACTIVE

## 2017-11-15 LAB — URIC ACID: URIC ACID, SERUM: 7.6 mg/dL (ref 4.0–8.0)

## 2017-11-17 ENCOUNTER — Encounter: Payer: Self-pay | Admitting: Family Medicine

## 2017-11-17 MED ORDER — ALLOPURINOL 100 MG PO TABS: 100.0000 mg | ORAL_TABLET | Freq: Every day | ORAL | 3 refills | Status: DC

## 2017-11-17 NOTE — Addendum Note (Signed)
Addended by: Rodolph Bong on: 11/17/2017 07:03 AM   Modules accepted: Orders

## 2018-03-07 DIAGNOSIS — H66002 Acute suppurative otitis media without spontaneous rupture of ear drum, left ear: Secondary | ICD-10-CM | POA: Diagnosis not present

## 2018-03-07 DIAGNOSIS — J069 Acute upper respiratory infection, unspecified: Secondary | ICD-10-CM | POA: Diagnosis not present

## 2018-03-14 ENCOUNTER — Encounter: Payer: Self-pay | Admitting: Emergency Medicine

## 2018-03-14 ENCOUNTER — Emergency Department
Admission: EM | Admit: 2018-03-14 | Discharge: 2018-03-14 | Disposition: A | Discharge status: Home / Self Care | Payer: BLUE CROSS/BLUE SHIELD | Source: Home / Self Care | Attending: Internal Medicine | Admitting: Internal Medicine

## 2018-03-14 ENCOUNTER — Other Ambulatory Visit: Payer: Self-pay

## 2018-03-14 DIAGNOSIS — J189 Pneumonia, unspecified organism: Secondary | ICD-10-CM | POA: Diagnosis not present

## 2018-03-14 HISTORY — DX: Gastro-esophageal reflux disease without esophagitis: K21.9

## 2018-03-14 HISTORY — DX: Gout, unspecified: M10.9

## 2018-03-14 MED ORDER — AZITHROMYCIN 250 MG PO TABS: 250.0000 mg | ORAL_TABLET | Freq: Every day | ORAL | 0 refills | Status: DC

## 2018-03-14 MED ORDER — PREDNISONE 10 MG (21) PO TBPK: ORAL_TABLET | Freq: Every day | ORAL | 0 refills | Status: DC

## 2018-03-14 MED ORDER — IPRATROPIUM-ALBUTEROL 0.5-2.5 (3) MG/3ML IN SOLN: 3.0000 mL | Freq: Once | RESPIRATORY_TRACT | Status: DC

## 2018-03-14 MED ORDER — HYDROCOD POLST-CPM POLST ER 10-8 MG/5ML PO SUER: 5.0000 mL | Freq: Every evening | ORAL | 0 refills | Status: DC | PRN

## 2018-03-14 NOTE — ED Triage Notes (Signed)
Presents with non-productive cough for 3wks.  Was seen in [facility] 1wk ago and prescribed amoxicillin with no relief. States has used other OTC meds with no help.

## 2018-03-14 NOTE — ED Provider Notes (Signed)
William Marshall    CSN: [id number] Arrival date & time: 03/14/18  1558     History   Chief Complaint Chief Complaint  Patient presents with  . Cough    HPI William Marshall is a 43 y.o. male.   43 yo male with past medical history of GERD presents to urgent [facility] complaining of cough.  The patient states that he has had some drainage in his throat for approx 3 weeks.  He was recently prescribed amoxicillin for an ear infection and has a few days left of the course. However, he states that his symptoms have worsened in that it hurts to cough and that now he also has headache.  He denies SOB but his wife reports that he appears fatigued.  The patient denies fever, nausea, vomiting or diarrhea.      Past Medical History:  Diagnosis Date  . GERD (gastroesophageal reflux disease)   . Gout   . History of osteomyelitis as a child     Patient Active Problem List   Diagnosis Date Noted  . Tinea versicolor 08/12/2017  . Allergic rhinitis 09/05/2015  . Personal history of other diseases of the musculoskeletal system and connective tissue 09/05/2015  . History of fracture of vertebra 09/05/2015  . Chronic cough 09/05/2015  . Transaminitis 09/05/2015    History reviewed. No pertinent surgical history.     Home Medications    Prior to Admission medications   Medication Sig Start Date End Date Taking? Authorizing Provider  AMOXICILLIN PO Take by mouth.   Yes [provider]  Fluticasone Propionate (FLONASE NA) Place into the nose.   Yes [provider]  PSEUDOEPHEDRINE HCL PO Take by mouth.   Yes [provider]  Pseudoephedrine-guaiFENesin (MUCINEX D PO) Take by mouth.   Yes [provider]  azithromycin (ZITHROMAX) 250 MG tablet Take 1 tablet (250 mg total) by mouth daily. Take first 2 tablets together, then 1 every day until finished. 03/14/18   Arnaldo Nataliamond, Laikynn Pollio S, MD  chlorpheniramine-HYDROcodone [facility](TUSSIONEX PENNKINETIC ER) 10-8 MG/5ML  SUER Take 5 mLs by mouth at bedtime as needed for up to 14 days for cough. 03/14/18 03/28/18  Arnaldo Nataliamond, Daleigh Pollinger S, MD  predniSONE (STERAPRED UNI-PAK 21 TAB) 10 MG (21) TBPK tablet Take by mouth daily. Take 6 tabs by mouth daily  for 2 days, then 5 tabs for 2 days, then 4 tabs for 2 days, then 3 tabs for 2 days, 2 tabs for 2 days, then 1 tab by mouth daily for 2 days 03/14/18   Arnaldo Nataliamond, Major Santerre S, MD    Family History Family History  Problem Relation Age of Onset  . Hypertension Mother   . Diabetes Mother   . Kidney disease Maternal Grandmother     Social History Social History   Tobacco Use  . Smoking status: Never Smoker  . Smokeless tobacco: Never Used  Substance Use Topics  . Alcohol use: Yes    Alcohol/week: 0.0 standard drinks  . Drug use: No     Allergies   Patient has no known allergies.   Review of Systems Review of Systems  Constitutional: Negative for chills and fever.  HENT: Negative for sore throat and tinnitus.   Eyes: Negative for redness.  Respiratory: Positive for cough. Negative for shortness of breath.   Cardiovascular: Negative for chest pain and palpitations.  Gastrointestinal: Negative for abdominal pain, diarrhea, nausea and vomiting.  Genitourinary: Negative for dysuria, frequency and urgency.  Musculoskeletal: Negative for myalgias.  Skin:  Negative for rash.       No lesions  Neurological: Positive for headaches. Negative for weakness.  Hematological: Does not bruise/bleed easily.  Psychiatric/Behavioral: Negative for suicidal ideas.     Physical Exam Triage Vital Signs ED Triage Vitals  Enc Vitals Group     BP 03/14/18 1638 140/90     Pulse Rate 03/14/18 1638 73     Resp --      Temp 03/14/18 1638 98 F (36.7 C)     Temp Source 03/14/18 1638 Oral     SpO2 03/14/18 1638 96 %     Weight 03/14/18 1639 254 lb (115.2 kg)     Height 03/14/18 1639 6\' 3"  (1.905 m)     Head Circumference --      Peak Flow --      Pain Score 03/14/18 1638 5      Pain Loc --      Pain Edu? --      Excl. in GC? --    No data found.  Updated Vital Signs BP 140/90 (BP Location: Right Arm)   Pulse 73   Temp 98 F (36.7 C) (Oral)   Ht 6\' 3"  (1.905 m)   Wt 115.2 kg   SpO2 96%   BMI 31.75 kg/m   Visual Acuity Right Eye Distance:   Left Eye Distance:   Bilateral Distance:    Right Eye Near:   Left Eye Near:    Bilateral Near:     Physical Exam Constitutional:      General: He is not in acute distress.    Appearance: He is well-developed.  HENT:     Head: Normocephalic and atraumatic.  Eyes:     General: No scleral icterus.    Conjunctiva/sclera: Conjunctivae normal.     Pupils: Pupils are equal, round, and reactive to light.  Neck:     Musculoskeletal: Normal range of motion and neck supple.     Thyroid: No thyromegaly.     Vascular: No JVD.     Trachea: No tracheal deviation.  Cardiovascular:     Rate and Rhythm: Normal rate and regular rhythm.     Heart sounds: Normal heart sounds. No murmur. No friction rub. No gallop.   Pulmonary:     Effort: Pulmonary effort is normal. No respiratory distress.     Breath sounds: Wheezing (faint; R>L) present.  Abdominal:     General: Bowel sounds are normal. There is no distension.     Palpations: Abdomen is soft.     Tenderness: There is no abdominal tenderness.  Musculoskeletal: Normal range of motion.  Lymphadenopathy:     Cervical: No cervical adenopathy.  Skin:    General: Skin is warm and dry.     Findings: No erythema or rash.  Neurological:     Mental Status: He is alert and oriented to person, place, and time.     Cranial Nerves: No cranial nerve deficit.  Psychiatric:        Behavior: Behavior normal.        Thought Content: Thought content normal.        Judgment: Judgment normal.      UC Treatments / Results  Labs (all labs ordered are listed, but only abnormal results are displayed) Labs Reviewed - No data to display  EKG None  Radiology No results  found.  Procedures Procedures (including critical Marshall time)  Medications Ordered in UC Medications  ipratropium-albuterol (DUONEB) 0.5-2.5 (3) MG/3ML nebulizer solution 3  mL (has no administration in time range)    Initial Impression / Assessment and Plan / UC Course  I have reviewed the triage vital signs and the nursing notes.  Pertinent labs & imaging results that were available during my Marshall of the patient were reviewed by me and considered in my medical decision making (see chart for details).     Given Duoneb in clinic for wheezing.  O2 sats >96% throughout encounter. Will treat for atypical pneumonia.  Ddx includes cough related to GERD but this would not cause peripheral wheezing.    Final Clinical Impressions(s) / UC Diagnoses   Final diagnoses:  Atypical pneumonia   Discharge Instructions   None    ED Prescriptions    Medication Sig Dispense Auth. Provider   predniSONE (STERAPRED UNI-PAK 21 TAB) 10 MG (21) TBPK tablet Take by mouth daily. Take 6 tabs by mouth daily  for 2 days, then 5 tabs for 2 days, then 4 tabs for 2 days, then 3 tabs for 2 days, 2 tabs for 2 days, then 1 tab by mouth daily for 2 days 42 tablet Arnaldo Nataliamond, Bannie Lobban S, MD   azithromycin (ZITHROMAX) 250 MG tablet Take 1 tablet (250 mg total) by mouth daily. Take first 2 tablets together, then 1 every day until finished. 6 tablet Arnaldo Nataliamond, Keziyah Kneale S, MD   chlorpheniramine-HYDROcodone [facility](TUSSIONEX PENNKINETIC ER) 10-8 MG/5ML SUER Take 5 mLs by mouth at bedtime as needed for up to 14 days for cough. 70 mL Arnaldo Nataliamond, Alonso Gapinski S, MD     Controlled Substance Prescriptions [place] Controlled Substance Registry consulted? Not Applicable   Arnaldo Nataliamond, Dantae Meunier S, MD 03/14/18 [phone]

## 2018-03-24 ENCOUNTER — Ambulatory Visit: Payer: BLUE CROSS/BLUE SHIELD | Admitting: Family Medicine

## 2018-03-24 ENCOUNTER — Encounter: Payer: Self-pay | Admitting: Family Medicine

## 2018-03-24 ENCOUNTER — Ambulatory Visit (INDEPENDENT_AMBULATORY_CARE_PROVIDER_SITE_OTHER): Payer: BLUE CROSS/BLUE SHIELD

## 2018-03-24 VITALS — BP 142/93 | HR 63 | Temp 98.3°F | Ht 75.0 in | Wt 258.0 lb

## 2018-03-24 DIAGNOSIS — R079 Chest pain, unspecified: Secondary | ICD-10-CM | POA: Diagnosis not present

## 2018-03-24 DIAGNOSIS — R0781 Pleurodynia: Secondary | ICD-10-CM | POA: Diagnosis not present

## 2018-03-24 DIAGNOSIS — R05 Cough: Secondary | ICD-10-CM

## 2018-03-24 DIAGNOSIS — R059 Cough, unspecified: Secondary | ICD-10-CM

## 2018-03-24 MED ORDER — TRAMADOL HCL 50 MG PO TABS: ORAL_TABLET | ORAL | 0 refills | Status: DC

## 2018-03-24 NOTE — Progress Notes (Signed)
William Marshall is a 43 y.o. male who presents to [name]: [facility] today for cough and left-sided chest pain.  William [facility] was seen in [facility] on December 28 it was thought to perhaps have otitis media in the setting of a viral URI.  He was treated with Augmentin.  He continued to experience cough and fatigue and was seen in urgent care on January 24.  He was treated empirically for possible atypical pneumonia with prednisone, azithromycin, and Tussionex.  He has continued to experience coughing and has now developed pain in the left lateral ribs.  Pain is worse with deep inspiration cough and is tender to touch on the left side.  He denies significant shortness of breath vomiting or diarrhea.   ROS as above:  Exam:  BP (!) 142/93   Pulse 63   Temp 98.3 F (36.8 C) (Oral)   Ht 6\' 3"  (1.905 m)   Wt 258 lb (117 kg)   SpO2 98%   BMI 32.25 kg/m  Wt Readings from Last 5 Encounters:  03/24/18 258 lb (117 kg)  03/14/18 254 lb (115.2 kg)  11/14/17 260 lb (117.9 kg)  09/18/17 253 lb (114.8 kg)  08/12/17 253 lb (114.8 kg)    Gen: Well NAD HEENT: EOMI,  MMM posterior pharynx with cobblestoning. Lungs: Normal work of breathing. CTABL Heart: RRR no MRG Chest wall: Tender palpation left lateral ribs.  No crepitations palpated. Abd: NABS, Soft. Nondistended, Nontender Exts: Brisk capillary refill, warm and well perfused.   Lab and Radiology Results Two-view chest x-ray images personally independently reviewed. No infiltrates, visible rib fractures, pneumothorax or pleural effusion.  Bronchitic changes present Await formal radiology review   Assessment and Plan: 43 y.o. male with cough congestion lateral chest wall pain.  Patient likely has post viral cough or post lung infection cough.  I do not think there is a significant pneumonia on my read of today's chest x-ray however  formal radiology review is still pending.  Plan for treatment with continued over-the-counter medications.  Will use tramadol for a bit of pain and cough suppression.  Work note provided.  Recheck if not improving.  Finish out course of prednisone.  PDMP reviewed during this encounter. Orders Placed This Encounter  Procedures  . DG Chest 2 View    Order Specific Question:   Reason for exam:    Answer:   Cough, assess intra-thoracic pathology. Left chest pain    Order Specific Question:   Preferred imaging location?    Answer:   [facility]   Meds ordered this encounter  Medications  . traMADol (ULTRAM) 50 MG tablet    Sig: 1 tab by mouth every 8 hours as needed for pain or cough    Dispense:  15 tablet    Refill:  0     Historical information moved to improve visibility of documentation.  Past Medical History:  Diagnosis Date  . GERD (gastroesophageal reflux disease)   . Gout   . History of osteomyelitis as a child    No past surgical history on file. Social History   Tobacco Use  . Smoking status: Never Smoker  . Smokeless tobacco: Never Used  Substance Use Topics  . Alcohol use: Yes    Alcohol/week: 0.0 standard drinks   family history includes Diabetes in his mother; Hypertension in his mother; Kidney disease in his maternal grandmother.  Medications: Current Outpatient Medications  Medication Sig Dispense Refill  .  Fluticasone Propionate (FLONASE NA) Place into the nose.    Marland Kitchen PSEUDOEPHEDRINE HCL PO Take by mouth.    . Pseudoephedrine-guaiFENesin (MUCINEX D PO) Take by mouth.    . traMADol (ULTRAM) 50 MG tablet 1 tab by mouth every 8 hours as needed for pain or cough 15 tablet 0   No current facility-administered medications for this visit.    No Known Allergies   Discussed warning signs or symptoms. Please see discharge instructions. Patient expresses understanding.

## 2018-03-24 NOTE — Patient Instructions (Signed)
Thank you for coming in today. For cough continue the over the counter medicine.  Muccinex DM twice daily.  OK to take with menthol cough drops.  Take iburprofen or aleve or tylenol for pain.   Ok to use tramadol sparingly for significant pain or cough especially at bedtime.  Do not take tramadol at work.   Call or go to the emergency room if you get worse, have trouble breathing, have chest pains, or palpitations.   People can cough for a few weeks after a lung infection.    Cough, Adult  Coughing is a reflex that clears your throat and your airways. Coughing helps to heal and protect your lungs. It is normal to cough occasionally, but a cough that happens with other symptoms or lasts a long time may be a sign of a condition that needs treatment. A cough may last only 2-3 weeks (acute), or it may last longer than 8 weeks (chronic). What are the causes? Coughing is commonly caused by:  Breathing in substances that irritate your lungs.  A viral or bacterial respiratory infection.  Allergies.  Asthma.  Postnasal drip.  Smoking.  Acid backing up from the stomach into the esophagus (gastroesophageal reflux).  Certain medicines.  Chronic lung problems, including COPD (or rarely, lung cancer).  Other medical conditions such as heart failure. Follow these instructions at home: Pay attention to any changes in your symptoms. Take these actions to help with your discomfort:  Take medicines only as told by your health care provider. ? If you were prescribed an antibiotic medicine, take it as told by your health care provider. Do not stop taking the antibiotic even if you start to feel better. ? Talk with your health care provider before you take a cough suppressant medicine.  Drink enough fluid to keep your urine clear or pale yellow.  If the air is dry, use a cold steam vaporizer or humidifier in your bedroom or your home to help loosen secretions.  Avoid anything that causes  you to cough at work or at home.  If your cough is worse at night, try sleeping in a semi-upright position.  Avoid cigarette smoke. If you smoke, quit smoking. If you need help quitting, ask your health care provider.  Avoid caffeine.  Avoid alcohol.  Rest as needed. Contact a health care provider if:  You have new symptoms.  You cough up pus.  Your cough does not get better after 2-3 weeks, or your cough gets worse.  You cannot control your cough with suppressant medicines and you are losing sleep.  You develop pain that is getting worse or pain that is not controlled with pain medicines.  You have a fever.  You have unexplained weight loss.  You have night sweats. Get help right away if:  You cough up blood.  You have difficulty breathing.  Your heartbeat is very fast. This information is not intended to replace advice given to you by your health care provider. Make sure you discuss any questions you have with your health care provider. Document Released: 08/24/2010 Document Revised: 08/03/2015 Document Reviewed: 05/04/2014 Elsevier Interactive Patient Education  2019 [employer].

## 2018-03-25 ENCOUNTER — Ambulatory Visit: Payer: BLUE CROSS/BLUE SHIELD | Admitting: Family Medicine

## 2018-09-21 ENCOUNTER — Other Ambulatory Visit: Payer: Self-pay | Admitting: Family Medicine

## 2018-10-05 ENCOUNTER — Encounter: Payer: Self-pay | Admitting: Family Medicine

## 2018-10-09 ENCOUNTER — Other Ambulatory Visit: Payer: Self-pay

## 2018-10-09 ENCOUNTER — Telehealth: Payer: Self-pay | Admitting: Family Medicine

## 2018-10-09 DIAGNOSIS — R6889 Other general symptoms and signs: Secondary | ICD-10-CM | POA: Diagnosis not present

## 2018-10-09 DIAGNOSIS — Z20822 Contact with and (suspected) exposure to covid-19: Secondary | ICD-10-CM

## 2018-10-09 NOTE — Telephone Encounter (Signed)
Spouse called. Pt's co-worker tested positive for COVID-19.Marland Kitchen He is headed over to [employer] now. Thanks.

## 2018-10-11 ENCOUNTER — Encounter: Payer: Self-pay | Admitting: Family Medicine

## 2018-10-11 LAB — NOVEL CORONAVIRUS, NAA: SARS-CoV-2, NAA: DETECTED — AB

## 2018-10-12 NOTE — Telephone Encounter (Signed)
error 

## 2018-10-15 ENCOUNTER — Telehealth (INDEPENDENT_AMBULATORY_CARE_PROVIDER_SITE_OTHER): Payer: BC Managed Care – PPO | Admitting: Family Medicine

## 2018-10-15 ENCOUNTER — Encounter (INDEPENDENT_AMBULATORY_CARE_PROVIDER_SITE_OTHER): Payer: Self-pay

## 2018-10-15 ENCOUNTER — Encounter: Payer: Self-pay | Admitting: Family Medicine

## 2018-10-15 DIAGNOSIS — R21 Rash and other nonspecific skin eruption: Secondary | ICD-10-CM | POA: Diagnosis not present

## 2018-10-15 DIAGNOSIS — U071 COVID-19: Secondary | ICD-10-CM

## 2018-10-15 MED ORDER — CLOBETASOL PROPIONATE 0.05 % EX CREA: 1.0000 "application " | TOPICAL_CREAM | Freq: Two times a day (BID) | CUTANEOUS | 2 refills | Status: DC

## 2018-10-15 MED ORDER — PREDNISONE 5 MG (48) PO TBPK: ORAL_TABLET | ORAL | 0 refills | Status: DC

## 2018-10-15 MED ORDER — DOXYCYCLINE HYCLATE 100 MG PO TABS: 100.0000 mg | ORAL_TABLET | Freq: Two times a day (BID) | ORAL | 0 refills | Status: DC

## 2018-10-15 NOTE — Progress Notes (Signed)
Virtual Visit  via Video Note  I connected with      William Marshall by a video enabled telemedicine application and verified that I am speaking with the correct person using two identifiers.   I discussed the limitations of evaluation and management by telemedicine and the availability of in person appointments. The patient expressed understanding and agreed to proceed.  History of Present Illness: William Marshall is a 43 y.o. male who would like to discuss rash.   Rash: Present on legs BL over the last week.  Patient notes the rash is consistent on small erythematous papules and occasional blisters.  He notes are typically quite itchy especially around bedtime.    Tried triamcinolone cream but that did not help much.  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  Rash has been present for over a week now not changed much.  He had the rash before COVID-19.  He denies any new medications.   COVID: Patient had workplace exposure to [place] and had recent COVID-19 test last week and was positive.  He is self quarantining.  He has been symptomatic for about a week now.  He is feeling overall pretty well.  No current fevers or chills or shortness of breath.  He notes a mild cough and some nasal congestion.     Observations/Objective: Exam: Appearance nontoxic no acute distress Normal Speech.  No rash on face.  No shortness of breath or wheezing.  Occasional infrequent cough.     Lab and Radiology Results No results found for this or any previous visit (from the past 72 hour(s)). No results found.   Assessment and Plan: 43 y.o. male with  Rash: Unclear etiology.  Likely arthropod bites.  Plan to continue triamcinolone cream will add higher potency clobetasol cream for hot spots.  Also use doxycycline as patient may have experienced secondary bacterial infection from scratching.  If not improving will use prednisone tapering course.  COVID-19: Currently doing quite  well without severe symptoms.  Plan for continued self-isolation.  Quarantine can and starting next week if asymptomatic at 14 days.  He should be asymptomatic for 3 days prior to quarantine ending.    PDMP not reviewed this encounter. No orders of the defined types were placed in this encounter.  Meds ordered this encounter  Medications  . clobetasol cream (TEMOVATE) 0.05 %    Sig: Apply 1 application topically 2 (two) times daily.    Dispense:  60 g    Refill:  2  . doxycycline (VIBRA-TABS) 100 MG tablet    Sig: Take 1 tablet (100 mg total) by mouth 2 (two) times daily.    Dispense:  14 tablet    Refill:  0  . predniSONE (STERAPRED UNI-PAK 48 TAB) 5 MG (48) TBPK tablet    Sig: 12 day dosepack po. Take if rash does not get better    Dispense:  48 tablet    Refill:  0    Follow Up Instructions:    I discussed the assessment and treatment plan with the patient. The patient was provided an opportunity to ask questions and all were answered. The patient agreed with the plan and demonstrated an understanding of the instructions.   The patient was advised to call back or seek an in-person evaluation if the symptoms worsen or if the condition fails to improve as anticipated.  Time: 15 minutes of intraservice time, with >22 minutes of total time during today's  visit.      Historical information moved to improve visibility of documentation.  Past Medical History:  Diagnosis Date  . GERD (gastroesophageal reflux disease)   . Gout   . History of osteomyelitis as a child    No past surgical history on file. Social History   Tobacco Use  . Smoking status: Never Smoker  . Smokeless tobacco: Never Used  Substance Use Topics  . Alcohol use: Yes    Alcohol/week: 0.0 standard drinks   family history includes Diabetes in his mother; Hypertension in his mother; Kidney disease in his maternal grandmother.  Medications: Current Outpatient Medications  Medication Sig Dispense Refill   . clobetasol cream (TEMOVATE) 0.05 % Apply 1 application topically 2 (two) times daily. 60 g 2  . doxycycline (VIBRA-TABS) 100 MG tablet Take 1 tablet (100 mg total) by mouth 2 (two) times daily. 14 tablet 0  . Fluticasone Propionate (FLONASE NA) Place into the nose.    . hydrOXYzine (ATARAX/VISTARIL) 50 MG tablet TAKE 1 TABLET (50 MG TOTAL) BY MOUTH EVERY 8 (EIGHT) HOURS AS NEEDED FOR ITCHING. 30 tablet 3  . predniSONE (STERAPRED UNI-PAK 48 TAB) 5 MG (48) TBPK tablet 12 day dosepack po. Take if rash does not get better 48 tablet 0  . PSEUDOEPHEDRINE HCL PO Take by mouth.    . Pseudoephedrine-guaiFENesin (MUCINEX D PO) Take by mouth.    . traMADol (ULTRAM) 50 MG tablet 1 tab by mouth every 8 hours as needed for pain or cough 15 tablet 0   No current facility-administered medications for this visit.    No Known Allergies

## 2018-10-16 ENCOUNTER — Encounter (INDEPENDENT_AMBULATORY_CARE_PROVIDER_SITE_OTHER): Payer: Self-pay

## 2018-10-17 ENCOUNTER — Encounter (INDEPENDENT_AMBULATORY_CARE_PROVIDER_SITE_OTHER): Payer: Self-pay

## 2018-10-18 ENCOUNTER — Encounter (INDEPENDENT_AMBULATORY_CARE_PROVIDER_SITE_OTHER): Payer: Self-pay

## 2018-10-19 ENCOUNTER — Encounter (INDEPENDENT_AMBULATORY_CARE_PROVIDER_SITE_OTHER): Payer: Self-pay

## 2018-10-20 ENCOUNTER — Encounter (INDEPENDENT_AMBULATORY_CARE_PROVIDER_SITE_OTHER): Payer: Self-pay

## 2018-10-21 ENCOUNTER — Encounter (INDEPENDENT_AMBULATORY_CARE_PROVIDER_SITE_OTHER): Payer: Self-pay

## 2018-10-22 ENCOUNTER — Encounter (INDEPENDENT_AMBULATORY_CARE_PROVIDER_SITE_OTHER): Payer: Self-pay

## 2018-10-23 ENCOUNTER — Encounter (INDEPENDENT_AMBULATORY_CARE_PROVIDER_SITE_OTHER): Payer: Self-pay

## 2018-10-24 ENCOUNTER — Encounter (INDEPENDENT_AMBULATORY_CARE_PROVIDER_SITE_OTHER): Payer: Self-pay

## 2018-10-25 ENCOUNTER — Encounter (INDEPENDENT_AMBULATORY_CARE_PROVIDER_SITE_OTHER): Payer: Self-pay

## 2018-10-26 ENCOUNTER — Encounter (INDEPENDENT_AMBULATORY_CARE_PROVIDER_SITE_OTHER): Payer: Self-pay

## 2018-10-27 ENCOUNTER — Encounter (INDEPENDENT_AMBULATORY_CARE_PROVIDER_SITE_OTHER): Payer: Self-pay

## 2018-10-28 ENCOUNTER — Encounter (INDEPENDENT_AMBULATORY_CARE_PROVIDER_SITE_OTHER): Payer: Self-pay

## 2018-11-18 ENCOUNTER — Other Ambulatory Visit: Payer: Self-pay | Admitting: Family Medicine

## 2018-11-18 MED ORDER — TRAMADOL HCL 50 MG PO TABS: ORAL_TABLET | ORAL | 0 refills | Status: DC

## 2019-03-19 ENCOUNTER — Other Ambulatory Visit: Payer: Self-pay | Admitting: Medical-Surgical

## 2019-03-19 MED ORDER — INDOMETHACIN 50 MG PO CAPS: 50.0000 mg | ORAL_CAPSULE | Freq: Three times a day (TID) | ORAL | 0 refills | Status: DC

## 2019-03-19 NOTE — Telephone Encounter (Signed)
Patient wife called and was in need of a refill of his indomethacin for gout flare up. I have sent in a temporary supply and patient has follow up on Monday with Joy. No other questions at this time. Patient was offered to go to ER if his pain was worse and the wife and patient declined.

## 2019-03-22 ENCOUNTER — Ambulatory Visit (INDEPENDENT_AMBULATORY_CARE_PROVIDER_SITE_OTHER): Payer: BC Managed Care – PPO | Admitting: Medical-Surgical

## 2019-03-22 ENCOUNTER — Encounter: Payer: Self-pay | Admitting: Medical-Surgical

## 2019-03-22 VITALS — Temp 97.9°F

## 2019-03-22 DIAGNOSIS — M109 Gout, unspecified: Secondary | ICD-10-CM

## 2019-03-22 MED ORDER — ALLOPURINOL 100 MG PO TABS: 100.0000 mg | ORAL_TABLET | Freq: Every day | ORAL | 6 refills | Status: DC

## 2019-03-22 MED ORDER — COLCHICINE 0.6 MG PO TABS: 0.6000 mg | ORAL_TABLET | Freq: Two times a day (BID) | ORAL | 0 refills | Status: DC

## 2019-03-22 NOTE — Patient Instructions (Signed)
Take colchicine twice daily until your gout flare resolves, then continue taking colchicine once daily for 3 months.   Start taking allopurinol once daily.  This is a preventative medicine to prevent further gout flares in the future.   You will need to have lab work drawn 1 month after starting your allopurinol.   Avoid high purine foods as much as possible.    Low-Purine Eating Plan A low-purine eating plan involves making food choices to limit your intake of purine. Purine is a kind of uric acid. Too much uric acid in your blood can cause certain conditions, such as gout and kidney stones. Eating a low-purine diet can help control these conditions. What are tips for following this plan? Reading food labels   Avoid foods with saturated or Trans fat.  Check the ingredient list of grains-based foods, such as bread and cereal, to make sure that they contain whole grains.  Check the ingredient list of sauces or soups to make sure they do not contain meat or fish.  When choosing soft drinks, check the ingredient list to make sure they do not contain high-fructose corn syrup. Shopping  Buy plenty of fresh fruits and vegetables.  Avoid buying canned or fresh fish.  Buy dairy products labeled as low-fat or nonfat.  Avoid buying premade or processed foods. These foods are often high in fat, salt (sodium), and added sugar. Cooking  Use olive oil instead of butter when cooking. Oils like olive oil, canola oil, and sunflower oil contain healthy fats. Meal planning  Learn which foods do or do not affect you. If you find out that a food tends to cause your gout symptoms to flare up, avoid eating that food. You can enjoy foods that do not cause problems. If you have any questions about a food item, talk with your dietitian or health care provider.  Limit foods high in fat, especially saturated fat. Fat makes it harder for your body to get rid of uric acid.  Choose foods that are  lower in fat and are lean sources of protein. General guidelines  Limit alcohol intake to no more than 1 drink a day for nonpregnant women and 2 drinks a day for men. One drink equals 12 oz of beer, 5 oz of wine, or 1 oz of hard liquor. Alcohol can affect the way your body gets rid of uric acid.  Drink plenty of water to keep your urine clear or pale yellow. Fluids can help remove uric acid from your body.  If directed by your health care provider, take a vitamin C supplement.  Work with your health care provider and dietitian to develop a plan to achieve or maintain a healthy weight. Losing weight can help reduce uric acid in your blood. What foods are recommended? The items listed may not be a complete list. Talk with your dietitian about what dietary choices are best for you. Foods low in purines Foods low in purines do not need to be limited. These include:  All fruits.  All low-purine vegetables, pickles, and olives.  Breads, pasta, rice, cornbread, and popcorn. Cake and other baked goods.  All dairy foods.  Eggs, nuts, and nut butters.  Spices and condiments, such as salt, herbs, and vinegar.  Plant oils, butter, and margarine.  Water, sugar-free soft drinks, tea, coffee, and cocoa.  Vegetable-based soups, broths, sauces, and gravies. Foods moderate in purines Foods moderate in purines should be limited to the amounts listed.   cup of  asparagus, cauliflower, spinach, mushrooms, or green peas, each day.  2/3 cup uncooked oatmeal, each day.   cup dry wheat bran or wheat germ, each day.  2-3 ounces of meat or poultry, each day.  4-6 ounces of shellfish, such as crab, lobster, oysters, or shrimp, each day.  1 cup cooked beans, peas, or lentils, each day.  Soup, broths, or bouillon made from meat or fish. Limit these foods as much as possible. What foods are not recommended? The items listed may not be a complete list. Talk with your dietitian about what dietary  choices are best for you. Limit your intake of foods high in purines, including:  Beer and other alcohol.  Meat-based gravy or sauce.  Canned or fresh fish, such as: ? Anchovies, sardines, herring, and tuna. ? Mussels and scallops. ? Codfish, trout, and haddock.  William Marshall.  Organ meats, such as: ? Liver or kidney. ? Tripe. ? Sweetbreads (thymus gland or pancreas).  Wild Education officer, environmental.  Yeast or yeast extract supplements.  Drinks sweetened with high-fructose corn syrup. Summary  Eating a low-purine diet can help control conditions caused by too much uric acid in the body, such as gout or kidney stones.  Choose low-purine foods, limit alcohol, and limit foods high in fat.  You will learn over time which foods do or do not affect you. If you find out that a food tends to cause your gout symptoms to flare up, avoid eating that food. This information is not intended to replace advice given to you by your health care provider. Make sure you discuss any questions you have with your health care provider. Document Revised: 02/07/2017 Document Reviewed: 04/10/2016 Elsevier Patient Education  2020 [employer].

## 2019-03-22 NOTE — Progress Notes (Signed)
Virtual Visit via Video Note  I connected with William Marshall on 03/22/19 at  9:10 AM EST by a video enabled telemedicine application and verified that I am speaking with the correct person using two identifiers.   I discussed the limitations of evaluation and management by telemedicine and the availability of in person appointments. The patient expressed understanding and agreed to proceed.  Subjective:    CC: gout flare  HPI:  44 year old male presenting today with reports of left great toe gout exacerbation.  Pain began on Sunday of last week then worsened on Tuesday.  Has been taking ibuprofen as needed with no relief. Started indomethacin 3 days ago with moderate relief. Reports left MTP joint is swollen, red with a knot approximately the size of a pinto bean on the side.  This is interfering with wearing shoes for work.  Reports last gout flare greater than 1 year ago.  Has been eating a lot of red meat and drinking more beer recently.   Past medical history, Surgical history, Family history not pertinant except as noted below, Social history, Allergies, and medications have been entered into the medical record, reviewed, and corrections made.   Review of Systems: No fevers, chills, night sweats, weight loss, chest pain, or shortness of breath.   Objective:    General: Speaking clearly in complete sentences without any shortness of breath.  Alert and oriented x3.  Normal judgment. No apparent acute distress.    Impression and Recommendations:    Podagra Colchicine 0.6 mg twice daily until flare resolved then continue once daily for 3 months.  Start allopurinol 100 mg daily for prophylaxis.  Continue indomethacin as needed.  Will need CMP, uric acid in 1 month.  Low purine diet information provided.  Return in about 1 month (around 04/22/2019) for gout follow up and labs.   I discussed the assessment and treatment plan with the patient. The patient was provided an opportunity to  ask questions and all were answered. The patient agreed with the plan and demonstrated an understanding of the instructions.   The patient was advised to call back or seek an in-person evaluation if the symptoms worsen or if the condition fails to improve as anticipated.  Thayer Ohm, DNP, APRN, FNP-BC [facility]

## 2019-03-22 NOTE — Assessment & Plan Note (Addendum)
Colchicine 0.6 mg twice daily until flare resolved then continue once daily for 3 months.  Start allopurinol 100 mg daily for prophylaxis.  Continue indomethacin as needed.  Will need CMP, uric acid in 1 month.  Low purine diet information provided.

## 2019-04-03 DIAGNOSIS — G8911 Acute pain due to trauma: Secondary | ICD-10-CM | POA: Diagnosis not present

## 2019-04-03 DIAGNOSIS — M109 Gout, unspecified: Secondary | ICD-10-CM | POA: Diagnosis not present

## 2019-04-03 DIAGNOSIS — S61212A Laceration without foreign body of right middle finger without damage to nail, initial encounter: Secondary | ICD-10-CM | POA: Diagnosis not present

## 2019-04-03 DIAGNOSIS — Z79899 Other long term (current) drug therapy: Secondary | ICD-10-CM | POA: Diagnosis not present

## 2019-04-03 DIAGNOSIS — M869 Osteomyelitis, unspecified: Secondary | ICD-10-CM | POA: Diagnosis not present

## 2019-04-03 DIAGNOSIS — W269XXA Contact with unspecified sharp object(s), initial encounter: Secondary | ICD-10-CM | POA: Diagnosis not present

## 2019-04-19 ENCOUNTER — Other Ambulatory Visit: Payer: Self-pay

## 2019-04-19 ENCOUNTER — Encounter: Payer: Self-pay | Admitting: Medical-Surgical

## 2019-04-19 ENCOUNTER — Other Ambulatory Visit (INDEPENDENT_AMBULATORY_CARE_PROVIDER_SITE_OTHER): Payer: BC Managed Care – PPO | Admitting: Medical-Surgical

## 2019-04-19 DIAGNOSIS — M109 Gout, unspecified: Secondary | ICD-10-CM

## 2019-05-24 DIAGNOSIS — Z20822 Contact with and (suspected) exposure to covid-19: Secondary | ICD-10-CM | POA: Diagnosis not present

## 2019-09-13 IMAGING — DX DG CHEST 2V
2 series · 2 of 2 positions shown · non-contrast
Comparison: 09/05/2015

CLINICAL DATA: LEFT side rib pain from coughing for over 1 month, 2
rounds of antibiotics, diagnosed with pneumonia over the weekend,
LEFT chest pain, history GERD and gout

EXAM:
CHEST - 2 VIEW

[chest pa]
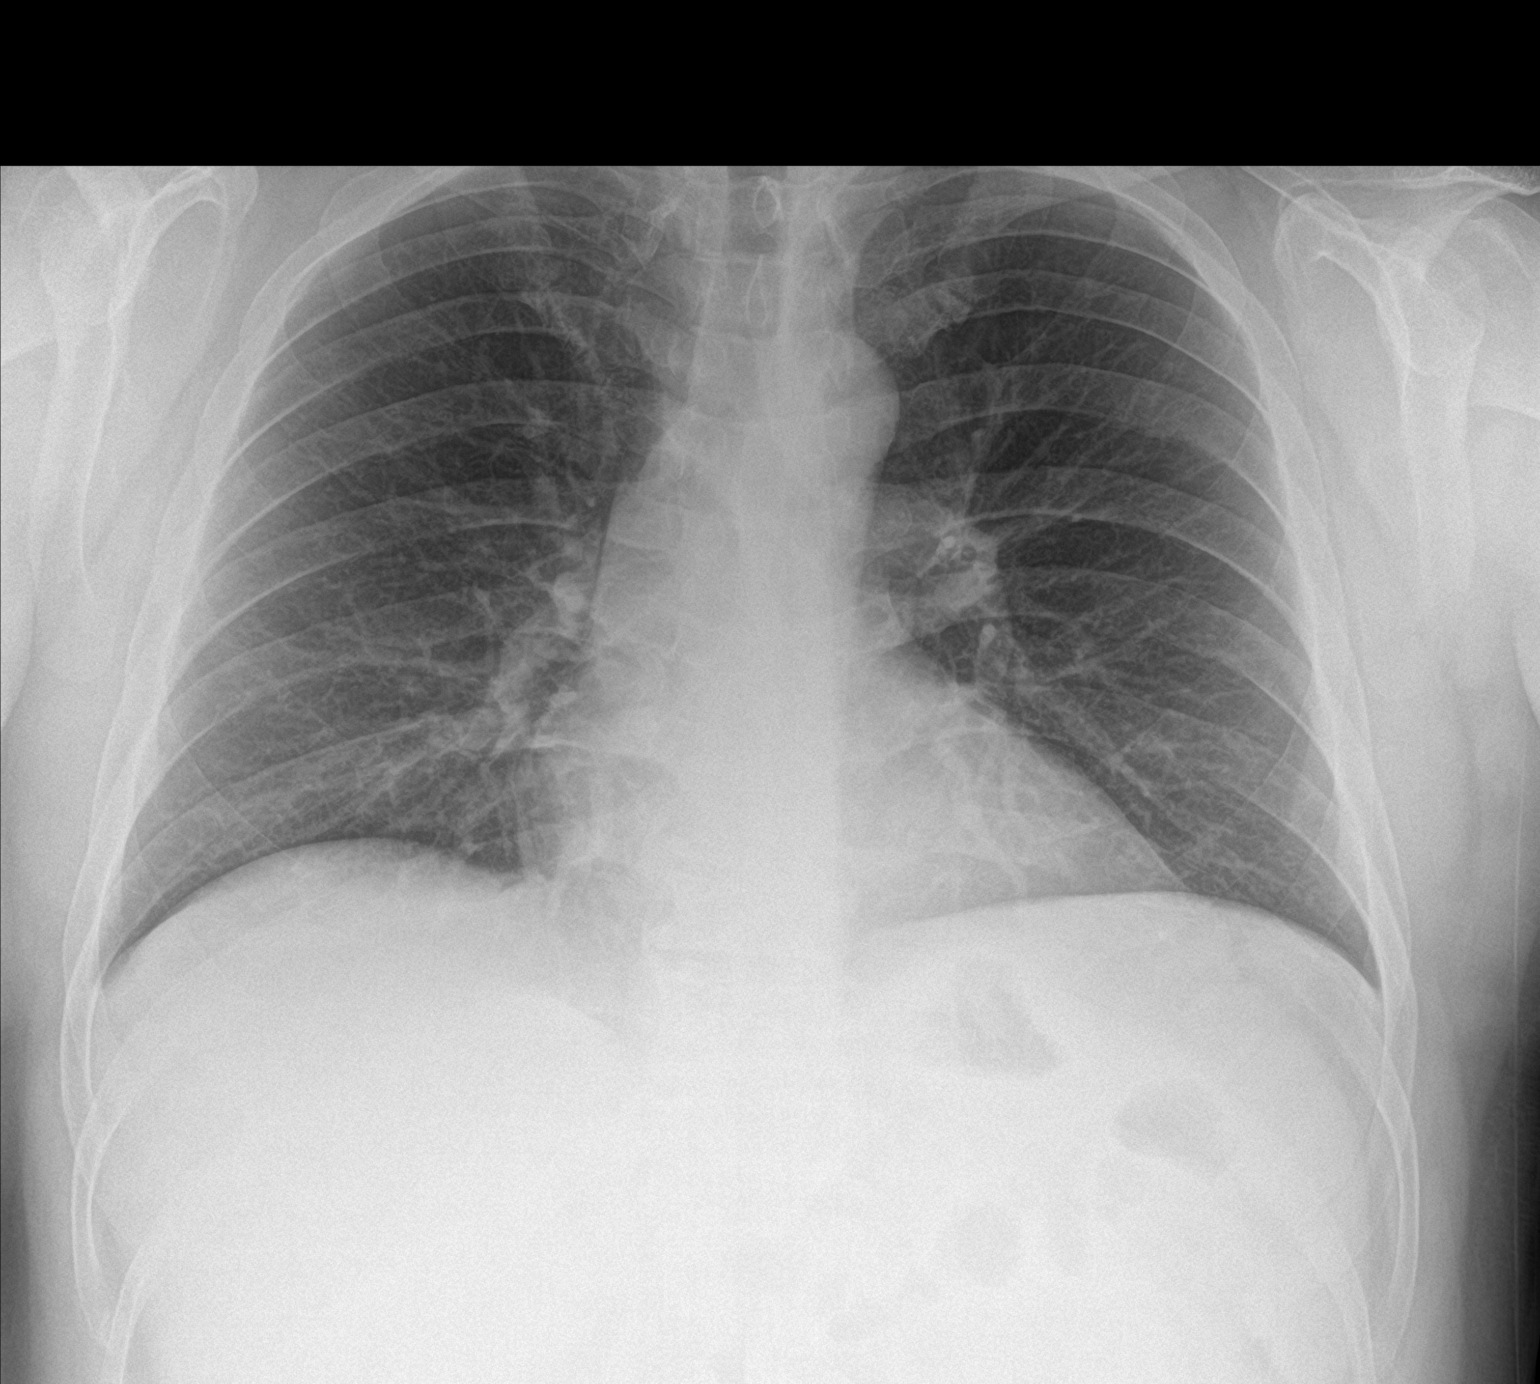

[chest lat]
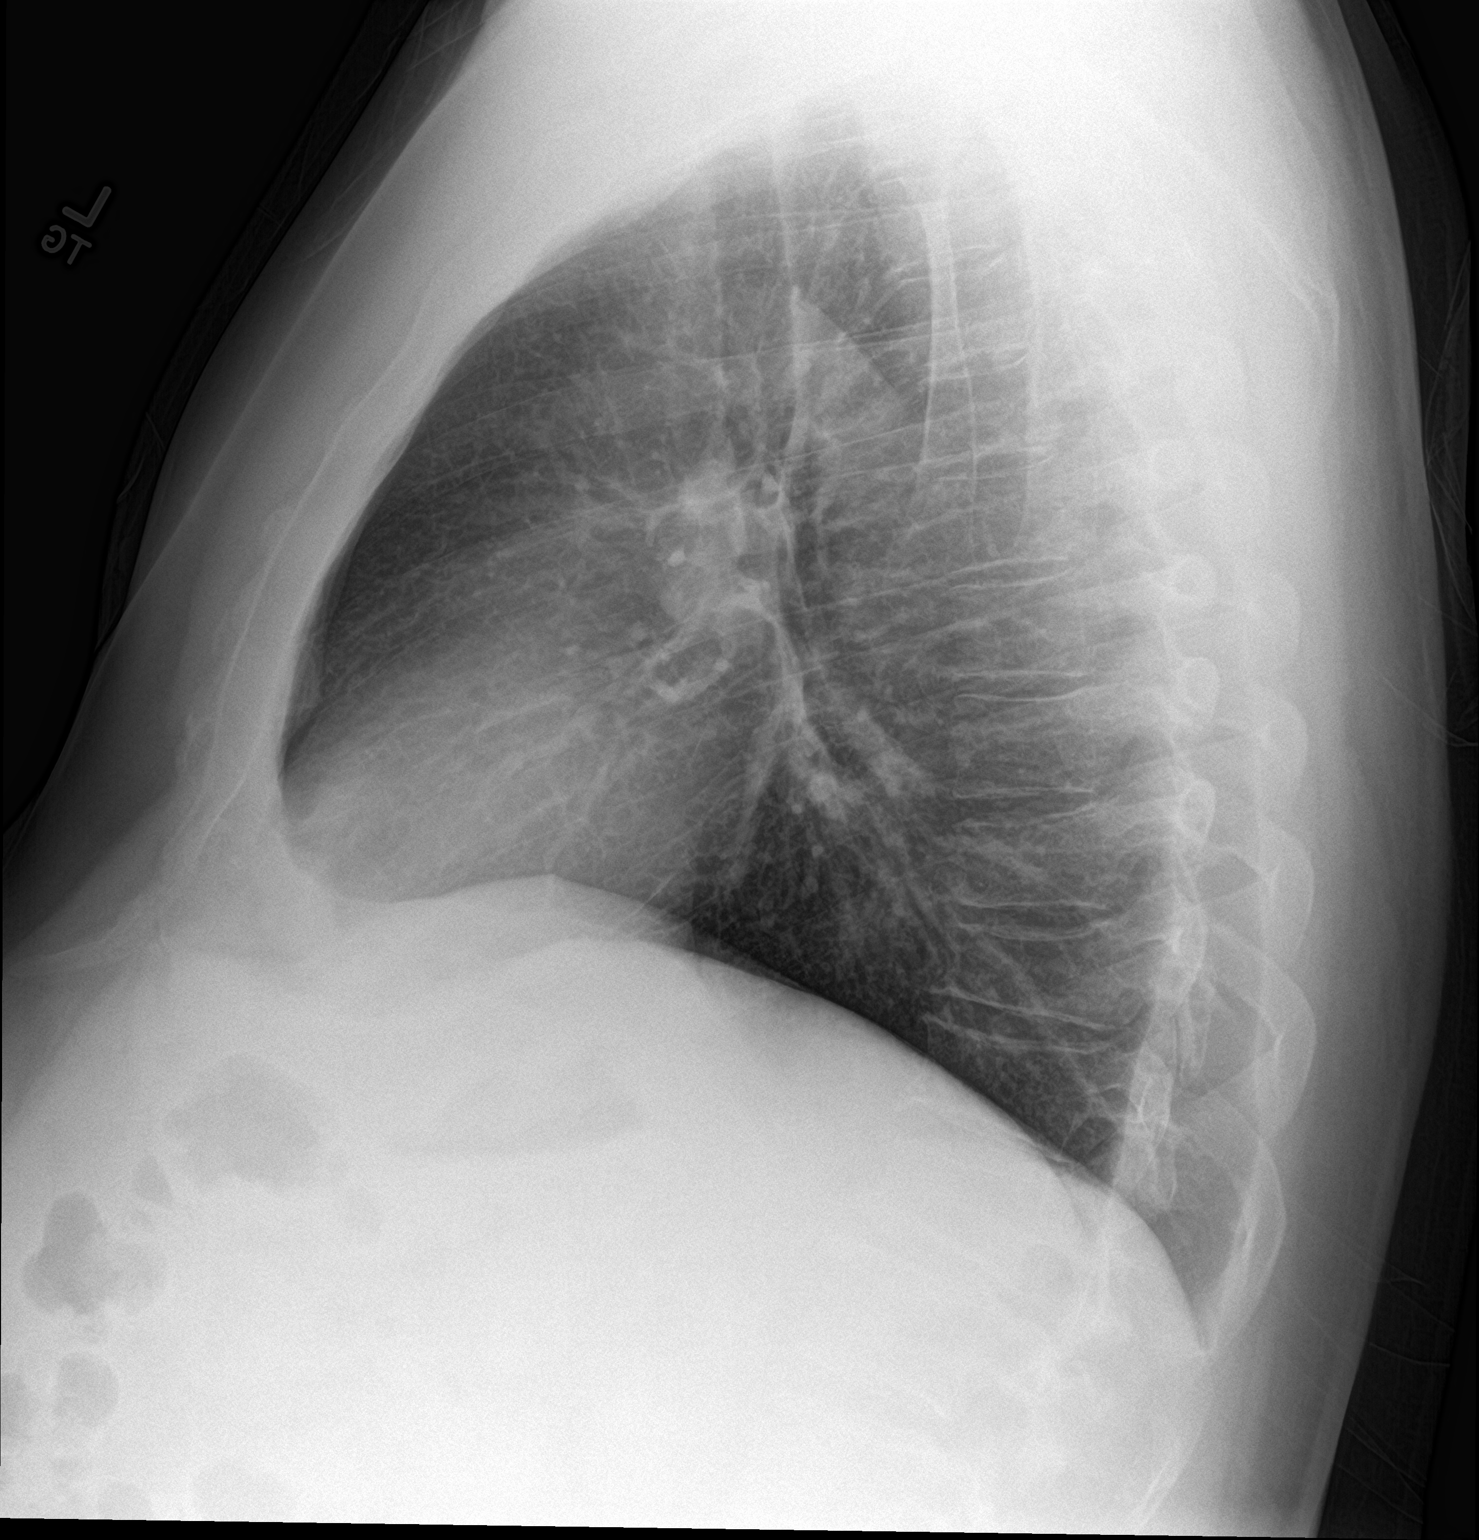

[2 of 2 positions shown; findings below may reference images not displayed]

FINDINGS: Normal heart size, mediastinal contours, and pulmonary vascularity.

Lungs clear.

No pleural effusion or pneumothorax.

Bones stable, with chronic superior endplate height losses of lower
thoracic vertebra.
IMPRESSION: No acute abnormalities.

## 2019-09-22 ENCOUNTER — Ambulatory Visit (INDEPENDENT_AMBULATORY_CARE_PROVIDER_SITE_OTHER): Payer: Worker's Compensation | Admitting: Family Medicine

## 2019-09-22 ENCOUNTER — Encounter: Payer: Self-pay | Admitting: Family Medicine

## 2019-09-22 ENCOUNTER — Other Ambulatory Visit: Payer: Self-pay

## 2019-09-22 DIAGNOSIS — S81811A Laceration without foreign body, right lower leg, initial encounter: Secondary | ICD-10-CM | POA: Diagnosis not present

## 2019-09-22 DIAGNOSIS — S81819A Laceration without foreign body, unspecified lower leg, initial encounter: Secondary | ICD-10-CM | POA: Insufficient documentation

## 2019-09-22 NOTE — Progress Notes (Signed)
William Marshall - 44 y.o. male MRN [id number]  Date of birth: [birth year]  Subjective Chief Complaint  Patient presents with  . Laceration    HPI William Marshall is a 44 y.o. male here today for ER follow up.  He had injury at work that occurred on 09/16/2019.  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  Seen at [facility] and had laceration repaired.  He was given 10 day course of cephalexin and a few norco for pain control.  Reports that he is doing well.  Still having some mild pain/pulling sensation from sutures.  He denies fever, increased swelling or drainage from area.  He does need updated FMLA paperwork as he is unable to work due to his injury.   ROS:  A comprehensive ROS was completed and negative except as noted per HPI  No Known Allergies  Past Medical History:  Diagnosis Date  . GERD (gastroesophageal reflux disease)   . Gout   . History of osteomyelitis as a child     History reviewed. No pertinent surgical history.  Social History   Socioeconomic History  . Marital status: Married    Spouse name: Not on file  . Number of children: Not on file  . Years of education: Not on file  . Highest education level: Not on file  Occupational History  . Not on file  Tobacco Use  . Smoking status: Never Smoker  . Smokeless tobacco: Never Used  Vaping Use  . Vaping Use: Never used  Substance and Sexual Activity  . Alcohol use: Yes    Alcohol/week: 0.0 standard drinks  . Drug use: No  . Sexual activity: Not on file  Other Topics Concern  . Not on file  Social History Narrative  . Not on file   Social Determinants of Health   Financial Resource Strain:   . Difficulty of Paying Living Expenses:   Food Insecurity:   . Worried About Programme researcher, broadcasting/film/video in the Last Year:   . Barista in the Last Year:   Transportation Needs:   . Freight forwarder (Medical):   Marland Kitchen Lack of Transportation (Non-Medical):   Physical Activity:   . Days of  Exercise per Week:   . Minutes of Exercise per Session:   Stress:   . Feeling of Stress :   Social Connections:   . Frequency of Communication with Friends and Family:   . Frequency of Social Gatherings with Friends and Family:   . Attends Religious Services:   . Active Member of Clubs or Organizations:   . Attends Banker Meetings:   Marland Kitchen Marital Status:     Family History  Problem Relation Age of Onset  . Hypertension Mother   . Diabetes Mother   . Kidney disease Maternal Grandmother     Health Maintenance  Topic Date Due  . COVID-19 Vaccine (1) Never done  . INFLUENZA VACCINE  10/10/2019  . TETANUS/TDAP  03/18/2027  . Hepatitis C Screening  Completed  . HIV Screening  Completed     ----------------------------------------------------------------------------------------------------------------------------------------------------------------------------------------------------------------- Physical Exam BP (!) 142/97 (BP Location: Left Arm, Patient Position: Sitting, Cuff Size: Large)   Pulse 77   Ht 6' 3.2" (1.91 m)   Wt 273 lb (123.8 kg)   SpO2 100%   BMI 33.94 kg/m   Physical Exam HENT:     Head: Normocephalic and atraumatic.  Eyes:     General: No scleral icterus.  Cardiovascular:     Rate and Rhythm: Normal rate.  Pulmonary:     Effort: Pulmonary effort is normal.     Breath sounds: Normal breath sounds.  Neurological:     General: No focal deficit present.  Psychiatric:        Mood and Affect: Mood normal.        Behavior: Behavior normal.     ------------------------------------------------------------------------------------------------------------------------------------------------------------------------------------------------------------------- Assessment and Plan  Laceration of leg Appears to be healing well.  He will return on 7/23 for suture removal.  Finish out cephalexin course.  Discussed red flags and signs of infection.      No orders of the defined types were placed in this encounter.   Return in about 8 days (around 09/30/2019) for Suture removal.    This visit occurred during the SARS-CoV-2 public health emergency.  Safety protocols were in place, including screening questions prior to the visit, additional usage of staff PPE, and extensive cleaning of exam room while observing appropriate contact time as indicated for disinfecting solutions.

## 2019-09-22 NOTE — Patient Instructions (Signed)
Return on 7/22 for suture removal.  If having increased pain, worsening redness or drainage let me know.

## 2019-09-22 NOTE — Assessment & Plan Note (Addendum)
Appears to be healing well.  He will return on 7/23 for suture removal.  Finish out cephalexin course.  Discussed red flags and signs of infection.

## 2019-09-22 NOTE — Telephone Encounter (Signed)
Form/physician billing form placed in provider's box for completion.

## 2019-09-28 ENCOUNTER — Telehealth: Payer: Self-pay

## 2019-09-28 NOTE — Telephone Encounter (Signed)
Pt FMLA documentation has been completed.   Charge form and documents given to [employer].  Copies sent to scan.   Patient to pick up documents at next appt.

## 2019-09-29 ENCOUNTER — Other Ambulatory Visit: Payer: Self-pay | Admitting: Medical-Surgical

## 2019-09-29 DIAGNOSIS — M109 Gout, unspecified: Secondary | ICD-10-CM

## 2019-09-29 NOTE — Telephone Encounter (Signed)
Routing to PCP

## 2019-10-01 ENCOUNTER — Ambulatory Visit (INDEPENDENT_AMBULATORY_CARE_PROVIDER_SITE_OTHER): Payer: Worker's Compensation | Admitting: Family Medicine

## 2019-10-01 ENCOUNTER — Encounter: Payer: Self-pay | Admitting: Family Medicine

## 2019-10-01 DIAGNOSIS — S81811A Laceration without foreign body, right lower leg, initial encounter: Secondary | ICD-10-CM | POA: Diagnosis not present

## 2019-10-01 MED ORDER — MUPIROCIN 2 % EX OINT: 1.0000 "application " | TOPICAL_OINTMENT | Freq: Two times a day (BID) | CUTANEOUS | 0 refills | Status: AC

## 2019-10-01 NOTE — Patient Instructions (Signed)
Use mupirocin ointment twice per day for the next 7 days.  Follow up if having increased pain, swelling or drainage.

## 2019-10-03 NOTE — Progress Notes (Signed)
William Marshall - 44 y.o. male MRN [id number]  Date of birth: 27-Dec-1975  Subjective Chief Complaint  Patient presents with  . Suture / Staple Removal    HPI William Marshall is a 44 y.o. male here today for follow up of laceration to R leg.  He has injury that occurred on 09/16/19.  He had laceration repair with sutures in the ED.  He is following up today for suture removal.  He has had minimal drainage.  He denies increased pain, fever, or redness of area.  He will need updated FMLA for his job.   ROS:  A comprehensive ROS was completed and negative except as noted per HPI  No Known Allergies  Past Medical History:  Diagnosis Date  . GERD (gastroesophageal reflux disease)   . Gout   . History of osteomyelitis as a child     History reviewed. No pertinent surgical history.  Social History   Socioeconomic History  . Marital status: Married    Spouse name: Not on file  . Number of children: Not on file  . Years of education: Not on file  . Highest education level: Not on file  Occupational History  . Not on file  Tobacco Use  . Smoking status: Never Smoker  . Smokeless tobacco: Never Used  Vaping Use  . Vaping Use: Never used  Substance and Sexual Activity  . Alcohol use: Yes    Alcohol/week: 0.0 standard drinks  . Drug use: No  . Sexual activity: Not on file  Other Topics Concern  . Not on file  Social History Narrative  . Not on file   Social Determinants of Health   Financial Resource Strain:   . Difficulty of Paying Living Expenses:   Food Insecurity:   . Worried About Programme researcher, broadcasting/film/video in the Last Year:   . Barista in the Last Year:   Transportation Needs:   . Freight forwarder (Medical):   Marland Kitchen Lack of Transportation (Non-Medical):   Physical Activity:   . Days of Exercise per Week:   . Minutes of Exercise per Session:   Stress:   . Feeling of Stress :   Social Connections:   . Frequency of Communication with Friends and Family:   . Frequency  of Social Gatherings with Friends and Family:   . Attends Religious Services:   . Active Member of Clubs or Organizations:   . Attends Banker Meetings:   Marland Kitchen Marital Status:     Family History  Problem Relation Age of Onset  . Hypertension Mother   . Diabetes Mother   . Kidney disease Maternal Grandmother     Health Maintenance  Topic Date Due  . COVID-19 Vaccine (1) Never done  . INFLUENZA VACCINE  10/10/2019  . TETANUS/TDAP  03/18/2027  . Hepatitis C Screening  Completed  . HIV Screening  Completed     ----------------------------------------------------------------------------------------------------------------------------------------------------------------------------------------------------------------- Physical Exam BP (!) 141/87 (BP Location: Left Arm, Patient Position: Sitting, Cuff Size: Large)   Pulse 71   Ht 6' 3.2" (1.91 m)   Wt (!) 272 lb 1.9 oz (123.4 kg)   SpO2 100%   BMI 33.83 kg/m   Physical Exam Constitutional:      Appearance: Normal appearance.  HENT:     Head: Normocephalic and atraumatic.  Cardiovascular:     Rate and Rhythm: Normal rate and regular rhythm.  Skin:    General: Skin is warm.     Comments: "V" shaped  wound to RLE.  There is no drainage or tenderness.  Sutures removed without difficulty.  There is some sloughing of lower edge of skin from bandage friction.  Small area of dehiscence along lower R side of wound  Neurological:     General: No focal deficit present.     Mental Status: He is alert.  Psychiatric:        Mood and Affect: Mood normal.        Behavior: Behavior normal.     ------------------------------------------------------------------------------------------------------------------------------------------------------------------------------------------------------------------- Assessment and Plan  Laceration of leg Laceration continues to heal well.  Sutures removed. I did place a couple of steri  strips along a small area of dehiscence.  I think this should continue to heal well.   No signs of infection however has some sloughing of skin along lower portion of skin flap.  Rx for mupirocin to area.  He will continue to remain out of work until 8/6 as his job is pretty physical  He will call me if having any problems.      Meds ordered this encounter  Medications  . mupirocin ointment (BACTROBAN) 2 %    Sig: Apply 1 application topically 2 (two) times daily for 7 days.    Dispense:  22 g    Refill:  0    No follow-ups on file.    This visit occurred during the SARS-CoV-2 public health emergency.  Safety protocols were in place, including screening questions prior to the visit, additional usage of staff PPE, and extensive cleaning of exam room while observing appropriate contact time as indicated for disinfecting solutions.

## 2019-10-03 NOTE — Assessment & Plan Note (Signed)
Laceration continues to heal well.  Sutures removed. I did place a couple of steri strips along a small area of dehiscence.  I think this should continue to heal well.   No signs of infection however has some sloughing of skin along lower portion of skin flap.  Rx for mupirocin to area.  He will continue to remain out of work until 8/6 as his job is pretty physical  He will call me if having any problems.

## 2019-10-13 ENCOUNTER — Other Ambulatory Visit: Payer: Self-pay | Admitting: Family Medicine

## 2019-10-13 ENCOUNTER — Encounter: Payer: Self-pay | Admitting: Family Medicine

## 2019-10-13 MED ORDER — CEPHALEXIN 500 MG PO CAPS: 500.0000 mg | ORAL_CAPSULE | Freq: Four times a day (QID) | ORAL | 0 refills | Status: AC

## 2019-10-15 ENCOUNTER — Ambulatory Visit (INDEPENDENT_AMBULATORY_CARE_PROVIDER_SITE_OTHER): Payer: Worker's Compensation | Admitting: Family Medicine

## 2019-10-15 ENCOUNTER — Other Ambulatory Visit: Payer: Self-pay

## 2019-10-15 ENCOUNTER — Encounter: Payer: Self-pay | Admitting: Family Medicine

## 2019-10-15 DIAGNOSIS — S81811D Laceration without foreign body, right lower leg, subsequent encounter: Secondary | ICD-10-CM | POA: Diagnosis not present

## 2019-10-16 NOTE — Progress Notes (Signed)
William Marshall - 44 y.o. male MRN [id number]  Date of birth: 15-Dec-1975  Subjective Chief Complaint  Patient presents with  . wound care    HPI William Marshall is a 44 y.o. male here today for follow up of leg wound.  Wound occurred from accident at work on 09/16/2019.  It had been healing well however has area of dehiscence at apex of wound.  He denies significant pain.  Drainage has lessened since restarting cephalexin.  He did see occupational medicine doctor and was told he could not be cleared to return to work for at least another 2-4 weeks.   ROS:  A comprehensive ROS was completed and negative except as noted per HPI  No Known Allergies  Past Medical History:  Diagnosis Date  . GERD (gastroesophageal reflux disease)   . Gout   . History of osteomyelitis as a child     History reviewed. No pertinent surgical history.  Social History   Socioeconomic History  . Marital status: Married    Spouse name: Not on file  . Number of children: Not on file  . Years of education: Not on file  . Highest education level: Not on file  Occupational History  . Not on file  Tobacco Use  . Smoking status: Never Smoker  . Smokeless tobacco: Never Used  Vaping Use  . Vaping Use: Never used  Substance and Sexual Activity  . Alcohol use: Yes    Alcohol/week: 0.0 standard drinks  . Drug use: No  . Sexual activity: Not on file  Other Topics Concern  . Not on file  Social History Narrative  . Not on file   Social Determinants of Health   Financial Resource Strain:   . Difficulty of Paying Living Expenses:   Food Insecurity:   . Worried About Programme researcher, broadcasting/film/video in the Last Year:   . Barista in the Last Year:   Transportation Needs:   . Freight forwarder (Medical):   Marland Kitchen Lack of Transportation (Non-Medical):   Physical Activity:   . Days of Exercise per Week:   . Minutes of Exercise per Session:   Stress:   . Feeling of Stress :   Social Connections:   . Frequency of  Communication with Friends and Family:   . Frequency of Social Gatherings with Friends and Family:   . Attends Religious Services:   . Active Member of Clubs or Organizations:   . Attends Banker Meetings:   Marland Kitchen Marital Status:     Family History  Problem Relation Age of Onset  . Hypertension Mother   . Diabetes Mother   . Kidney disease Maternal Grandmother     Health Maintenance  Topic Date Due  . COVID-19 Vaccine (1) Never done  . INFLUENZA VACCINE  10/10/2019  . TETANUS/TDAP  03/18/2027  . Hepatitis C Screening  Completed  . HIV Screening  Completed     ----------------------------------------------------------------------------------------------------------------------------------------------------------------------------------------------------------------- Physical Exam BP (!) 136/97 (BP Location: Left Arm, Patient Position: Sitting, Cuff Size: Normal)   Pulse 70   Ht 6' 3.2" (1.91 m)   Wt 270 lb 4.8 oz (122.6 kg)   SpO2 100%   BMI 33.61 kg/m   Physical Exam Constitutional:      Appearance: Normal appearance.  Skin:    Comments: Laceration well healed around upper edges of wound however closer to apex the wound edges appear to be non-viable.    Neurological:     General: No focal  deficit present.     Mental Status: He is alert.     ------------------------------------------------------------------------------------------------------------------------------------------------------------------------------------------------------------------- Assessment and Plan  Laceration of leg Will continue to monitor over the next week, he will send me updated pictures or follow up with me at that time.  Red flags reviewed.   D/c mupirocin Continue cephalexin.  If area appears to continue to poorly heal around apex of laceration we discussed referral to wound care/surgeon for debridement of non-viable tissue.   Note given to extend time away from work.     No orders of the defined types were placed in this encounter.   No follow-ups on file.    This visit occurred during the SARS-CoV-2 public health emergency.  Safety protocols were in place, including screening questions prior to the visit, additional usage of staff PPE, and extensive cleaning of exam room while observing appropriate contact time as indicated for disinfecting solutions.

## 2019-10-16 NOTE — Assessment & Plan Note (Signed)
Will continue to monitor over the next week, he will send me updated pictures or follow up with me at that time.  Red flags reviewed.   D/c mupirocin Continue cephalexin.  If area appears to continue to poorly heal around apex of laceration we discussed referral to wound care/surgeon for debridement of non-viable tissue.   Note given to extend time away from work.

## 2019-10-25 ENCOUNTER — Encounter: Payer: Self-pay | Admitting: Family Medicine

## 2019-11-04 ENCOUNTER — Encounter: Payer: Self-pay | Admitting: Family Medicine

## 2019-11-05 ENCOUNTER — Encounter: Payer: Self-pay | Admitting: Family Medicine

## 2019-11-07 DIAGNOSIS — S86821A Laceration of other muscle(s) and tendon(s) at lower leg level, right leg, initial encounter: Secondary | ICD-10-CM | POA: Insufficient documentation

## 2019-11-08 ENCOUNTER — Encounter: Payer: Self-pay | Admitting: Family Medicine

## 2019-11-08 ENCOUNTER — Ambulatory Visit: Payer: BC Managed Care – PPO | Admitting: Family Medicine

## 2019-11-08 ENCOUNTER — Other Ambulatory Visit: Payer: Self-pay

## 2019-11-08 ENCOUNTER — Ambulatory Visit (INDEPENDENT_AMBULATORY_CARE_PROVIDER_SITE_OTHER): Payer: BC Managed Care – PPO

## 2019-11-08 VITALS — BP 146/86 | HR 78 | Wt 267.1 lb

## 2019-11-08 DIAGNOSIS — S81811D Laceration without foreign body, right lower leg, subsequent encounter: Secondary | ICD-10-CM | POA: Diagnosis not present

## 2019-11-08 DIAGNOSIS — R109 Unspecified abdominal pain: Secondary | ICD-10-CM | POA: Diagnosis not present

## 2019-11-08 DIAGNOSIS — N2889 Other specified disorders of kidney and ureter: Secondary | ICD-10-CM | POA: Diagnosis not present

## 2019-11-08 LAB — POCT URINALYSIS DIP (CLINITEK)
Bilirubin, UA: NEGATIVE
Blood, UA: NEGATIVE
Glucose, UA: NEGATIVE mg/dL
Ketones, POC UA: NEGATIVE mg/dL
Leukocytes, UA: NEGATIVE
Nitrite, UA: NEGATIVE
POC PROTEIN,UA: NEGATIVE
Spec Grav, UA: 1.01 (ref 1.010–1.025)
Urobilinogen, UA: 0.2 E.U./dL
pH, UA: 6.5 (ref 5.0–8.0)

## 2019-11-08 MED ORDER — TAMSULOSIN HCL 0.4 MG PO CAPS: 0.4000 mg | ORAL_CAPSULE | Freq: Every day | ORAL | 0 refills | Status: DC

## 2019-11-08 NOTE — Progress Notes (Signed)
William Marshall - 44 y.o. male MRN [id number]  Date of birth: May 09, 1975  Subjective Chief Complaint  Patient presents with  . Flank Pain    HPI William Marshall is a 44 y.o. male here today with R flank/back pain.  Symptom onset was a few days ago.  Had severe pain in R flank area, similar to previous kidney stone.  Pain has settled down but still comes and goes.  He denies radiation of pain, dysuria or hematuria.  Pain does not change with positional change.  He has had decreased appetite.  He denies fever, chills or nausea.    Leg wound complicated by delayed healing.  Seen by wound specialists and recommended that he may return to light duty.    ROS:  A comprehensive ROS was completed and negative except as noted per HPI  No Known Allergies  Past Medical History:  Diagnosis Date  . GERD (gastroesophageal reflux disease)   . Gout   . History of osteomyelitis as a child     History reviewed. No pertinent surgical history.  Social History   Socioeconomic History  . Marital status: Married    Spouse name: Not on file  . Number of children: Not on file  . Years of education: Not on file  . Highest education level: Not on file  Occupational History  . Not on file  Tobacco Use  . Smoking status: Never Smoker  . Smokeless tobacco: Never Used  Vaping Use  . Vaping Use: Never used  Substance and Sexual Activity  . Alcohol use: Yes    Alcohol/week: 0.0 standard drinks  . Drug use: No  . Sexual activity: Not on file  Other Topics Concern  . Not on file  Social History Narrative  . Not on file   Social Determinants of Health   Financial Resource Strain:   . Difficulty of Paying Living Expenses: Not on file  Food Insecurity:   . Worried About Programme researcher, broadcasting/film/video in the Last Year: Not on file  . Ran Out of Food in the Last Year: Not on file  Transportation Needs:   . Lack of Transportation (Medical): Not on file  . Lack of Transportation (Non-Medical): Not on file  Physical  Activity:   . Days of Exercise per Week: Not on file  . Minutes of Exercise per Session: Not on file  Stress:   . Feeling of Stress : Not on file  Social Connections:   . Frequency of Communication with Friends and Family: Not on file  . Frequency of Social Gatherings with Friends and Family: Not on file  . Attends Religious Services: Not on file  . Active Member of Clubs or Organizations: Not on file  . Attends Banker Meetings: Not on file  . Marital Status: Not on file    Family History  Problem Relation Age of Onset  . Hypertension Mother   . Diabetes Mother   . Kidney disease Maternal Grandmother     Health Maintenance  Topic Date Due  . COVID-19 Vaccine (1) Never done  . INFLUENZA VACCINE  10/10/2019  . TETANUS/TDAP  03/18/2027  . Hepatitis C Screening  Completed  . HIV Screening  Completed     ----------------------------------------------------------------------------------------------------------------------------------------------------------------------------------------------------------------- Physical Exam BP (!) 146/86 (BP Location: Left Arm, Patient Position: Sitting, Cuff Size: Large)   Pulse 78   Wt 267 lb 1.9 oz (121.2 kg)   SpO2 95%   BMI 33.21 kg/m   Physical Exam Constitutional:  Appearance: Normal appearance.  HENT:     Head: Normocephalic and atraumatic.  Cardiovascular:     Rate and Rhythm: Normal rate and regular rhythm.     Pulses: Normal pulses.     Heart sounds: Normal heart sounds.  Abdominal:     General: Abdomen is flat. There is no distension.     Palpations: Abdomen is soft.     Tenderness: There is no abdominal tenderness. There is right CVA tenderness (mild). There is no left CVA tenderness.  Musculoskeletal:        General: Normal range of motion.     Cervical back: Neck supple.     Comments: No spasm or muscle tenderness noted.   Skin:    Comments: Slowly healing wound to RLE.  Wound bed is fairly dry  without signs of infection.    Neurological:     General: No focal deficit present.     Mental Status: He is alert.  Psychiatric:        Mood and Affect: Mood normal.     ------------------------------------------------------------------------------------------------------------------------------------------------------------------------------------------------------------------- Assessment and Plan  Laceration of leg Complicated by slow healing.  Evaluated by wound care and recommended that he may return back to light duty. However given the nature of his job, light duty may not be an option.    Wound expected to heal routinely over the next several weeks.    Flank pain UA unremarkable.  Check KUB  Start flomax and strain urine  If symptoms persist CT may be needed for further evaluation.     Meds ordered this encounter  Medications  . tamsulosin (FLOMAX) 0.4 MG CAPS capsule    Sig: Take 1 capsule (0.4 mg total) by mouth daily.    Dispense:  30 capsule    Refill:  0    No follow-ups on file.    This visit occurred during the SARS-CoV-2 public health emergency.  Safety protocols were in place, including screening questions prior to the visit, additional usage of staff PPE, and extensive cleaning of exam room while observing appropriate contact time as indicated for disinfecting solutions.

## 2019-11-08 NOTE — Assessment & Plan Note (Signed)
UA unremarkable.  Check KUB  Start flomax and strain urine  If symptoms persist CT may be needed for further evaluation.

## 2019-11-08 NOTE — Assessment & Plan Note (Addendum)
Complicated by slow healing.  Evaluated by wound care and recommended that he may return back to light duty. However given the nature of his job, light duty may not be an option.    Wound expected to heal routinely over the next several weeks.

## 2019-11-08 NOTE — Patient Instructions (Signed)
Have xray completed, we'll be in touch with results.  Start flomax and strain urine.

## 2019-11-09 ENCOUNTER — Encounter: Payer: Self-pay | Admitting: Family Medicine

## 2019-11-09 ENCOUNTER — Other Ambulatory Visit: Payer: Self-pay | Admitting: Family Medicine

## 2019-11-09 DIAGNOSIS — R1031 Right lower quadrant pain: Secondary | ICD-10-CM

## 2019-11-12 ENCOUNTER — Ambulatory Visit (INDEPENDENT_AMBULATORY_CARE_PROVIDER_SITE_OTHER): Payer: BC Managed Care – PPO

## 2019-11-12 ENCOUNTER — Other Ambulatory Visit: Payer: Self-pay

## 2019-11-12 ENCOUNTER — Encounter: Payer: Self-pay | Admitting: Family Medicine

## 2019-11-12 DIAGNOSIS — R188 Other ascites: Secondary | ICD-10-CM | POA: Diagnosis not present

## 2019-11-12 DIAGNOSIS — R1031 Right lower quadrant pain: Secondary | ICD-10-CM

## 2019-11-12 DIAGNOSIS — K760 Fatty (change of) liver, not elsewhere classified: Secondary | ICD-10-CM | POA: Diagnosis not present

## 2019-11-12 DIAGNOSIS — N281 Cyst of kidney, acquired: Secondary | ICD-10-CM | POA: Diagnosis not present

## 2019-11-12 DIAGNOSIS — N200 Calculus of kidney: Secondary | ICD-10-CM | POA: Diagnosis not present

## 2019-11-17 ENCOUNTER — Other Ambulatory Visit: Payer: Self-pay

## 2019-11-17 ENCOUNTER — Ambulatory Visit (INDEPENDENT_AMBULATORY_CARE_PROVIDER_SITE_OTHER): Payer: BC Managed Care – PPO | Admitting: Family Medicine

## 2019-11-17 ENCOUNTER — Encounter: Payer: Self-pay | Admitting: Family Medicine

## 2019-11-17 DIAGNOSIS — S81811D Laceration without foreign body, right lower leg, subsequent encounter: Secondary | ICD-10-CM | POA: Diagnosis not present

## 2019-11-17 NOTE — Assessment & Plan Note (Signed)
Wound continues to heal in slow but routine manner.  He will remain out of work for now until 9/24 to allow for complete healing as there remains increased risk of dehiscence and infection if he returns to full duty at this time.

## 2019-11-17 NOTE — Progress Notes (Signed)
William Marshall - 44 y.o. male MRN [id number]  Date of birth: [birth year]  Subjective Chief Complaint  Patient presents with  . Medical Clearance    HPI William Marshall is a 44 y.o. male here today for follow up of worker comp case regarding injury to R leg that occurred on 09/16/19.  He has experienced some delayed healing and was advised that he may return to light duty if available.  He was told by HR department that light duty is not available for his role and that he would need to remain out until cleared to return to full duty.  He denies pain around area.  There is not drainage or redness around wound.   ROS:  A comprehensive ROS was completed and negative except as noted per HPI  No Known Allergies  Past Medical History:  Diagnosis Date  . GERD (gastroesophageal reflux disease)   . Gout   . History of osteomyelitis as a child     History reviewed. No pertinent surgical history.  Social History   Socioeconomic History  . Marital status: Married    Spouse name: Not on file  . Number of children: Not on file  . Years of education: Not on file  . Highest education level: Not on file  Occupational History  . Not on file  Tobacco Use  . Smoking status: Never Smoker  . Smokeless tobacco: Never Used  Vaping Use  . Vaping Use: Never used  Substance and Sexual Activity  . Alcohol use: Yes    Alcohol/week: 0.0 standard drinks  . Drug use: No  . Sexual activity: Not on file  Other Topics Concern  . Not on file  Social History Narrative  . Not on file   Social Determinants of Health   Financial Resource Strain:   . Difficulty of Paying Living Expenses: Not on file  Food Insecurity:   . Worried About Programme researcher, broadcasting/film/video in the Last Year: Not on file  . Ran Out of Food in the Last Year: Not on file  Transportation Needs:   . Lack of Transportation (Medical): Not on file  . Lack of Transportation (Non-Medical): Not on file  Physical Activity:   . Days of Exercise per Week:  Not on file  . Minutes of Exercise per Session: Not on file  Stress:   . Feeling of Stress : Not on file  Social Connections:   . Frequency of Communication with Friends and Family: Not on file  . Frequency of Social Gatherings with Friends and Family: Not on file  . Attends Religious Services: Not on file  . Active Member of Clubs or Organizations: Not on file  . Attends Banker Meetings: Not on file  . Marital Status: Not on file    Family History  Problem Relation Age of Onset  . Hypertension Mother   . Diabetes Mother   . Kidney disease Maternal Grandmother     Health Maintenance  Topic Date Due  . COVID-19 Vaccine (1) Never done  . INFLUENZA VACCINE  10/10/2019  . TETANUS/TDAP  03/18/2027  . Hepatitis C Screening  Completed  . HIV Screening  Completed     ----------------------------------------------------------------------------------------------------------------------------------------------------------------------------------------------------------------- Physical Exam BP (!) 144/102 (BP Location: Left Arm, Patient Position: Sitting, Cuff Size: Large)   Pulse 72   Ht 6\' 3"  (1.905 m)   Wt 267 lb (121.1 kg)   SpO2 99%   BMI 33.37 kg/m   Physical Exam Constitutional:  Appearance: Normal appearance.  HENT:     Head: Normocephalic and atraumatic.  Musculoskeletal:     Cervical back: Neck supple.  Skin:    Comments: "V" shaped wound on RLE.  Mostly healed with some scabbed over areas that remain on distal R side of wound.    Neurological:     General: No focal deficit present.     Mental Status: He is alert.  Psychiatric:        Mood and Affect: Mood normal.        Behavior: Behavior normal.     ------------------------------------------------------------------------------------------------------------------------------------------------------------------------------------------------------------------- Assessment and Plan  Laceration  of leg Wound continues to heal in slow but routine manner.  He will remain out of work for now until 9/24 to allow for complete healing as there remains increased risk of dehiscence and infection if he returns to full duty at this time.     No orders of the defined types were placed in this encounter.   No follow-ups on file.    This visit occurred during the SARS-CoV-2 public health emergency.  Safety protocols were in place, including screening questions prior to the visit, additional usage of staff PPE, and extensive cleaning of exam room while observing appropriate contact time as indicated for disinfecting solutions.

## 2019-11-23 ENCOUNTER — Encounter: Payer: Self-pay | Admitting: Family Medicine

## 2019-11-26 ENCOUNTER — Telehealth: Payer: Self-pay

## 2019-11-26 NOTE — Telephone Encounter (Signed)
Form completed and placed in assistant's box

## 2019-11-26 NOTE — Telephone Encounter (Signed)
Fitness Certification form has been completed by Dr. Ashley Royalty.   Made a copy. Gave original to [employer] for patient pick-up.   Sent pt MyChart message advising form ready for pick-up

## 2019-11-30 ENCOUNTER — Other Ambulatory Visit: Payer: Self-pay | Admitting: Family Medicine

## 2019-12-01 ENCOUNTER — Other Ambulatory Visit: Payer: Self-pay | Admitting: Family Medicine

## 2019-12-26 ENCOUNTER — Other Ambulatory Visit: Payer: Self-pay | Admitting: Family Medicine

## 2019-12-28 DIAGNOSIS — H66002 Acute suppurative otitis media without spontaneous rupture of ear drum, left ear: Secondary | ICD-10-CM | POA: Diagnosis not present

## 2019-12-28 DIAGNOSIS — J209 Acute bronchitis, unspecified: Secondary | ICD-10-CM | POA: Diagnosis not present

## 2019-12-28 DIAGNOSIS — R059 Cough, unspecified: Secondary | ICD-10-CM | POA: Diagnosis not present

## 2019-12-28 DIAGNOSIS — R030 Elevated blood-pressure reading, without diagnosis of hypertension: Secondary | ICD-10-CM | POA: Diagnosis not present

## 2020-01-10 ENCOUNTER — Ambulatory Visit (INDEPENDENT_AMBULATORY_CARE_PROVIDER_SITE_OTHER): Payer: BC Managed Care – PPO

## 2020-01-10 ENCOUNTER — Other Ambulatory Visit: Payer: Self-pay

## 2020-01-10 ENCOUNTER — Ambulatory Visit: Payer: BC Managed Care – PPO | Admitting: Family Medicine

## 2020-01-10 ENCOUNTER — Encounter: Payer: Self-pay | Admitting: Family Medicine

## 2020-01-10 VITALS — BP 147/80 | HR 82 | Temp 97.6°F | Wt 272.8 lb

## 2020-01-10 DIAGNOSIS — R599 Enlarged lymph nodes, unspecified: Secondary | ICD-10-CM | POA: Diagnosis not present

## 2020-01-10 DIAGNOSIS — R0602 Shortness of breath: Secondary | ICD-10-CM | POA: Diagnosis not present

## 2020-01-10 DIAGNOSIS — R0600 Dyspnea, unspecified: Secondary | ICD-10-CM

## 2020-01-10 DIAGNOSIS — R053 Chronic cough: Secondary | ICD-10-CM

## 2020-01-10 DIAGNOSIS — R21 Rash and other nonspecific skin eruption: Secondary | ICD-10-CM | POA: Diagnosis not present

## 2020-01-10 DIAGNOSIS — R059 Cough, unspecified: Secondary | ICD-10-CM | POA: Insufficient documentation

## 2020-01-10 LAB — CBC WITH DIFFERENTIAL/PLATELET
Absolute Monocytes: 828 cells/uL (ref 200–950)
Basophils Absolute: 71 cells/uL (ref 0–200)
Basophils Relative: 0.8 %
Eosinophils Absolute: 365 cells/uL (ref 15–500)
Eosinophils Relative: 4.1 %
HCT: 41.8 % (ref 38.5–50.0)
Hemoglobin: 14.6 g/dL (ref 13.2–17.1)
Lymphs Abs: 1780 cells/uL (ref 850–3900)
MCH: 33.3 pg — ABNORMAL HIGH (ref 27.0–33.0)
MCHC: 34.9 g/dL (ref 32.0–36.0)
MCV: 95.2 fL (ref 80.0–100.0)
MPV: 11.1 fL (ref 7.5–12.5)
Monocytes Relative: 9.3 %
Neutro Abs: 5856 cells/uL (ref 1500–7800)
Neutrophils Relative %: 65.8 %
Platelets: 233 10*3/uL (ref 140–400)
RBC: 4.39 10*6/uL (ref 4.20–5.80)
RDW: 13.1 % (ref 11.0–15.0)
Total Lymphocyte: 20 %
WBC: 8.9 10*3/uL (ref 3.8–10.8)

## 2020-01-10 MED ORDER — TRIAMCINOLONE ACETONIDE 0.1 % EX CREA: 1.0000 "application " | TOPICAL_CREAM | Freq: Two times a day (BID) | CUTANEOUS | 0 refills | Status: DC

## 2020-01-10 MED ORDER — BUTENAFINE HCL 1 % EX CREA: TOPICAL_CREAM | CUTANEOUS | 0 refills | Status: DC

## 2020-01-10 NOTE — Progress Notes (Signed)
William Marshall - 44 y.o. male MRN [id number]  Date of birth: May 05, 1975  Subjective Chief Complaint  Patient presents with  . Rash  . Shortness of Breath  . Mass    HPI William Marshall is a 44 y.o. male here today with complaint of rash and enlarged lymph node and cough.    -Rash located on upper arms and around waistline.  Areas are itchy.  He has tried some OTC creams without much improvement.    -Enlarged lymph node along R side of neck.  Mildly tender.  He denies fever, chills, sore throat, ear pain.  He was seen at urgent care recently and treated for bronchitis and otitis media.  He continues to have some cough and mild dyspnea.  He denies chest pain.  HE did recently receive COVID vaccines.    ROS:  A comprehensive ROS was completed and negative except as noted per HPI  No Known Allergies  Past Medical History:  Diagnosis Date  . GERD (gastroesophageal reflux disease)   . Gout   . History of osteomyelitis as a child     History reviewed. No pertinent surgical history.  Social History   Socioeconomic History  . Marital status: Married    Spouse name: Not on file  . Number of children: Not on file  . Years of education: Not on file  . Highest education level: Not on file  Occupational History  . Not on file  Tobacco Use  . Smoking status: Never Smoker  . Smokeless tobacco: Never Used  Vaping Use  . Vaping Use: Never used  Substance and Sexual Activity  . Alcohol use: Yes    Alcohol/week: 0.0 standard drinks  . Drug use: No  . Sexual activity: Not on file  Other Topics Concern  . Not on file  Social History Narrative  . Not on file   Social Determinants of Health   Financial Resource Strain:   . Difficulty of Paying Living Expenses: Not on file  Food Insecurity:   . Worried About Programme researcher, broadcasting/film/video in the Last Year: Not on file  . Ran Out of Food in the Last Year: Not on file  Transportation Needs:   . Lack of Transportation (Medical): Not on file  .  Lack of Transportation (Non-Medical): Not on file  Physical Activity:   . Days of Exercise per Week: Not on file  . Minutes of Exercise per Session: Not on file  Stress:   . Feeling of Stress : Not on file  Social Connections:   . Frequency of Communication with Friends and Family: Not on file  . Frequency of Social Gatherings with Friends and Family: Not on file  . Attends Religious Services: Not on file  . Active Member of Clubs or Organizations: Not on file  . Attends Banker Meetings: Not on file  . Marital Status: Not on file    Family History  Problem Relation Age of Onset  . Hypertension Mother   . Diabetes Mother   . Kidney disease Maternal Grandmother     Health Maintenance  Topic Date Due  . TETANUS/TDAP  03/18/2027  . INFLUENZA VACCINE  Completed  . COVID-19 Vaccine  Completed  . Hepatitis C Screening  Completed  . HIV Screening  Completed     ----------------------------------------------------------------------------------------------------------------------------------------------------------------------------------------------------------------- Physical Exam BP (!) 147/80 (BP Location: Left Arm, Patient Position: Sitting, Cuff Size: Large)   Pulse 82   Temp 97.6 F (36.4 C) (Oral)   Wt  272 lb 12.8 oz (123.7 kg)   SpO2 98%   BMI 34.10 kg/m   Physical Exam Constitutional:      Appearance: He is well-developed.  HENT:     Head: Normocephalic and atraumatic.     Right Ear: Tympanic membrane normal.     Left Ear: Tympanic membrane normal.     Mouth/Throat:     Mouth: Mucous membranes are moist.  Eyes:     General: No scleral icterus. Cardiovascular:     Rate and Rhythm: Normal rate and regular rhythm.  Pulmonary:     Effort: Pulmonary effort is normal.     Breath sounds: Normal breath sounds.  Musculoskeletal:     Cervical back: Neck supple.  Lymphadenopathy:     Cervical: Cervical adenopathy (R sided cervical nodes enlarged,  mildly tender. ) present.  Neurological:     General: No focal deficit present.     Mental Status: He is alert.  Psychiatric:        Mood and Affect: Mood normal.        Behavior: Behavior normal.     ------------------------------------------------------------------------------------------------------------------------------------------------------------------------------------------------------------------- Assessment and Plan  Chronic cough Acute on chronic cough.   CXR ordered.  Check CBC  Enlarged lymph node Likely related to recent COVID vaccination.  I think this will resolve on it's own but he will let me know if this persists.    Rash Has fungal appearance.  Start lotrimin ultra.  Will also add triamcinolone to help with itching.    Meds ordered this encounter  Medications  . triamcinolone cream (KENALOG) 0.1 %    Sig: Apply 1 application topically 2 (two) times daily.    Dispense:  80 g    Refill:  0  . Butenafine HCl (LOTRIMIN ULTRA) 1 % cream    Sig: Apply thin layer twice per day.    Dispense:  30 g    Refill:  0    No follow-ups on file.    This visit occurred during the SARS-CoV-2 public health emergency.  Safety protocols were in place, including screening questions prior to the visit, additional usage of staff PPE, and extensive cleaning of exam room while observing appropriate contact time as indicated for disinfecting solutions.

## 2020-01-10 NOTE — Assessment & Plan Note (Signed)
Has fungal appearance.  Start lotrimin ultra.  Will also add triamcinolone to help with itching.

## 2020-01-10 NOTE — Assessment & Plan Note (Signed)
Likely related to recent COVID vaccination.  I think this will resolve on it's own but he will let me know if this persists.

## 2020-01-10 NOTE — Telephone Encounter (Signed)
Pt no longer under Dr. Corey's care. Refill denied.  

## 2020-01-10 NOTE — Assessment & Plan Note (Signed)
Acute on chronic cough.   CXR ordered.  Check CBC

## 2020-01-10 NOTE — Patient Instructions (Signed)
Nice to see you today.  The enlarged lymph node may be due to immune response from the COVID vaccine.  Have labs and xray done today.  Let me know if not improving over the next 2-3 weeks or if you develop new symptoms.

## 2020-03-10 ENCOUNTER — Other Ambulatory Visit: Payer: Self-pay | Admitting: Family Medicine

## 2020-03-10 DIAGNOSIS — M109 Gout, unspecified: Secondary | ICD-10-CM

## 2020-04-25 ENCOUNTER — Other Ambulatory Visit: Payer: Self-pay

## 2020-04-25 ENCOUNTER — Encounter: Payer: Self-pay | Admitting: Medical-Surgical

## 2020-04-25 ENCOUNTER — Ambulatory Visit: Payer: Managed Care, Other (non HMO) | Admitting: Medical-Surgical

## 2020-04-25 VITALS — BP 130/85 | HR 90 | Temp 98.4°F | Ht 75.0 in | Wt 279.5 lb

## 2020-04-25 DIAGNOSIS — M549 Dorsalgia, unspecified: Secondary | ICD-10-CM | POA: Diagnosis not present

## 2020-04-25 DIAGNOSIS — B354 Tinea corporis: Secondary | ICD-10-CM | POA: Diagnosis not present

## 2020-04-25 LAB — POCT URINALYSIS DIP (CLINITEK)
Bilirubin, UA: NEGATIVE
Blood, UA: NEGATIVE
Glucose, UA: NEGATIVE mg/dL
Ketones, POC UA: NEGATIVE mg/dL
Leukocytes, UA: NEGATIVE
Nitrite, UA: NEGATIVE
POC PROTEIN,UA: NEGATIVE
Spec Grav, UA: 1.015 (ref 1.010–1.025)
Urobilinogen, UA: 0.2 E.U./dL
pH, UA: 7 (ref 5.0–8.0)

## 2020-04-25 MED ORDER — TRIAMCINOLONE ACETONIDE 0.1 % EX CREA: 1.0000 "application " | TOPICAL_CREAM | Freq: Two times a day (BID) | CUTANEOUS | 0 refills | Status: DC

## 2020-04-25 MED ORDER — TERBINAFINE HCL 250 MG PO TABS: 250.0000 mg | ORAL_TABLET | Freq: Every day | ORAL | 0 refills | Status: DC

## 2020-04-25 MED ORDER — PREDNISONE 50 MG PO TABS: 50.0000 mg | ORAL_TABLET | Freq: Every day | ORAL | 0 refills | Status: DC

## 2020-04-25 NOTE — Progress Notes (Signed)
Subjective:    CC: back pain, rash  HPI: Pleasant 45 year old male presenting for the following:  Rash: was seen in 01/2020 by his PCP who prescribed Triamcinolone and Lamisil Ultra for a rash in his bilateral groin areas. Notes that using the medications helped and he used them until the rash improved. He stopped the topical medications and the rash returned. He has gone through this cycle several times, each time noting that the rash spread a bit further. Now he has both of his arms, hands, and midaxillary regions affected. The rash is severely itchy. He has been using an antifungal body wash in the shower which has helped a little.   Back pain- history of kidney stones with one confirmed kidney stone passed. A couple of days ago, had back pain that moved around to his left flank and then to his abdomen. Pain was severe and hindered his daily activities. Today, his symptoms have completely resolved. Denies other associated symptoms such as dysuria, fever, chills, nausea, vomiting, or GI upset.   I reviewed the past medical history, family history, social history, surgical history, and allergies today and no changes were needed.  Please see the problem list section below in epic for further details.  Past Medical History: Past Medical History:  Diagnosis Date  . GERD (gastroesophageal reflux disease)   . Gout   . History of osteomyelitis as a child    Past Surgical History: History reviewed. No pertinent surgical history. Social History: Social History   Socioeconomic History  . Marital status: Married    Spouse name: Not on file  . Number of children: Not on file  . Years of education: Not on file  . Highest education level: Not on file  Occupational History  . Not on file  Tobacco Use  . Smoking status: Never Smoker  . Smokeless tobacco: Never Used  Vaping Use  . Vaping Use: Never used  Substance and Sexual Activity  . Alcohol use: Yes    Alcohol/week: 0.0 standard drinks   . Drug use: No  . Sexual activity: Not on file  Other Topics Concern  . Not on file  Social History Narrative  . Not on file   Social Determinants of Health   Financial Resource Strain: Not on file  Food Insecurity: Not on file  Transportation Needs: Not on file  Physical Activity: Not on file  Stress: Not on file  Social Connections: Not on file   Family History: Family History  Problem Relation Age of Onset  . Hypertension Mother   . Diabetes Mother   . Kidney disease Maternal Grandmother    Allergies: No Known Allergies Medications: See med rec.  Review of Systems: See HPI for pertinent positives and negatives.   Objective:    General: Well Developed, well nourished, and in no acute distress.  Neuro: Alert and oriented x3.  HEENT: Normocephalic, atraumatic.  Skin: Warm and dry. Diffuse erythematous rash affecting the bilateral upper arms on the medial aspect, bilateral anterior forearms, bilateral palms, and lateral aspects of the torso. Wood's lamp evaluation with small areas of florescence scattered over affected area.  Cardiac: Regular rate and rhythm, no murmurs rubs or gallops, no lower extremity edema.  Respiratory: Clear to auscultation bilaterally. Not using accessory muscles, speaking in full sentences.  Impression and Recommendations:    1. Back pain, unspecified back location, unspecified back pain laterality, unspecified chronicity POCT UA negative. Symptoms resolved. No further evaluation indicated.  - POCT URINALYSIS DIP (CLINITEK)  2. Tinea corporis Suspect a tinea infection that has been suboptimally treated topically due to early discontinuation of topical therapy without full resolution. Scratching causing inflammation is compounding the situation. Switching to systemic treatment with terbinafine 250mg  daily and prednisone 50mg  daily. Refilling topical triamcinolone cream to help with itching as needed. If rash worsens or fails to improve with  prescribed therapy, recommend return for further evaluation and possible biopsy.   Return if symptoms worsen or fail to improve. ___________________________________________ , DNP, APRN, FNP-BC Primary Care and Sports Medicine [facility] [place]

## 2020-05-04 LAB — COMPREHENSIVE METABOLIC PANEL
Albumin: 4.6 (ref 3.5–5.0)
Calcium: 9.5 (ref 8.7–10.7)
GFR calc Af Amer: 86
GFR calc non Af Amer: 75
Globulin: 2.5

## 2020-05-04 LAB — CBC AND DIFFERENTIAL
HCT: 47 (ref 41–53)
Hemoglobin: 15.6 (ref 13.5–17.5)
Platelets: 295 (ref 150–399)
WBC: 7.6

## 2020-05-04 LAB — BASIC METABOLIC PANEL
BUN: 12 (ref 4–21)
CO2: 21 (ref 13–22)
Chloride: 102 (ref 99–108)
Creatinine: 1.2 (ref 0.6–1.3)
Glucose: 100
Potassium: 4.5 (ref 3.4–5.3)
Sodium: 139 (ref 137–147)

## 2020-05-04 LAB — CBC: RBC: 4.9 (ref 3.87–5.11)

## 2020-05-04 LAB — TSH: TSH: 2.12 (ref 0.41–5.90)

## 2020-05-04 LAB — HEPATIC FUNCTION PANEL
ALT: 227 — AB (ref 10–40)
AST: 77 — AB (ref 14–40)
Alkaline Phosphatase: 81 (ref 25–125)
Bilirubin, Total: 0.7

## 2020-05-04 LAB — LIPID PANEL
Cholesterol: 216 — AB (ref 0–200)
HDL: 70 (ref 35–70)
LDL Cholesterol: 135
Triglycerides: 65 (ref 40–160)

## 2020-05-04 LAB — PSA: PSA: 0.6

## 2020-05-15 ENCOUNTER — Other Ambulatory Visit: Payer: Self-pay | Admitting: Medical-Surgical

## 2020-05-15 DIAGNOSIS — M109 Gout, unspecified: Secondary | ICD-10-CM

## 2020-05-19 LAB — HM COLONOSCOPY

## 2020-05-30 ENCOUNTER — Encounter: Payer: Self-pay | Admitting: Family Medicine

## 2020-09-01 ENCOUNTER — Ambulatory Visit: Payer: Managed Care, Other (non HMO) | Admitting: Medical-Surgical

## 2020-09-01 ENCOUNTER — Encounter: Payer: Self-pay | Admitting: Family Medicine

## 2020-09-01 ENCOUNTER — Encounter: Payer: Self-pay | Admitting: Medical-Surgical

## 2020-09-01 ENCOUNTER — Other Ambulatory Visit: Payer: Self-pay

## 2020-09-01 VITALS — BP 124/80 | HR 57 | Temp 97.8°F | Ht 75.0 in | Wt 273.3 lb

## 2020-09-01 DIAGNOSIS — R21 Rash and other nonspecific skin eruption: Secondary | ICD-10-CM | POA: Diagnosis not present

## 2020-09-01 DIAGNOSIS — L301 Dyshidrosis [pompholyx]: Secondary | ICD-10-CM | POA: Diagnosis not present

## 2020-09-01 MED ORDER — PREDNISONE 50 MG PO TABS: 50.0000 mg | ORAL_TABLET | Freq: Every day | ORAL | 0 refills | Status: DC

## 2020-09-01 MED ORDER — CLOBETASOL PROPIONATE 0.05 % EX CREA: 1.0000 "application " | TOPICAL_CREAM | Freq: Two times a day (BID) | CUTANEOUS | 0 refills | Status: DC

## 2020-09-01 NOTE — Progress Notes (Signed)
Subjective:    CC: Skin concerns  HPI: Pleasant 45 year old male presenting today for evaluation of a rash that has returned since his last visit in February.  Notes that he has several areas that are involved.  Notes that his hands are particularly troublesome.  He has areas that develop on the palmar surface and fingers of both hands.  Notes these areas seem to be small blisters that will ooze and are very itchy.  His hands get dry which does not seem to help with the itching.  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  Finds that steaming hot water is pleasant on the rash.  Also has sporadic spots on his bilateral upper inner arms, waist, groin, and legs.  He has a small red areas that are similar to his presentation in February.  When he came in February, he was provided with prednisone and notes that after taking that, his rash disappeared.  Unfortunately reappeared a few weeks later.  He has had no changes in foods, medications, cosmetics, chemicals, environmental agents, materials, etc.  They have been unable to find a potential trigger for his skin issues.  He has not been evaluated by dermatology so far.  I reviewed the past medical history, family history, social history, surgical history, and allergies today and no changes were needed.  Please see the problem list section below in epic for further details.  Past Medical History: Past Medical History:  Diagnosis Date   GERD (gastroesophageal reflux disease)    Gout    History of osteomyelitis as a child    Past Surgical History: History reviewed. No pertinent surgical history. Social History: Social History   Socioeconomic History   Marital status: Married    Spouse name: Not on file   Number of children: Not on file   Years of education: Not on file   Highest education level: Not on file  Occupational History   Not on file  Tobacco Use   Smoking status: Never   Smokeless tobacco:  Never  Vaping Use   Vaping Use: Never used  Substance and Sexual Activity   Alcohol use: Yes    Alcohol/week: 0.0 standard drinks   Drug use: No   Sexual activity: Not on file  Other Topics Concern   Not on file  Social History Narrative   Not on file   Social Determinants of Health   Financial Resource Strain: Not on file  Food Insecurity: Not on file  Transportation Needs: Not on file  Physical Activity: Not on file  Stress: Not on file  Social Connections: Not on file   Family History: Family History  Problem Relation Age of Onset   Hypertension Mother    Diabetes Mother    Kidney disease Maternal Grandmother    Allergies: No Known Allergies Medications: See med rec.  Review of Systems: See HPI for pertinent positives and negatives.   Objective:    General: Well Developed, well nourished, and in no acute distress.  Neuro: Alert and oriented x3.  HEENT: Normocephalic, atraumatic.  Skin: Warm and dry.  Scattered red maculopapular rashes most notable on the right upper inner arm, no vesicles or pustules present.  Bilateral palmar surface of hands and fingers dry, cracking with several small open areas that patient reports where the sites of blisters.  Small area of tiny blisters noted on the inner right wrist, intact Cardiac: Regular rate and rhythm, no murmurs rubs or gallops,  no lower extremity edema.  Respiratory: Clear to auscultation bilaterally. Not using accessory muscles, speaking in full sentences.  Impression and Recommendations:    1. Dyshidrotic eczema Severe dyshidrotic eczema to bilateral hands.  Sending in clobetasol twice daily.  We will do a 5-day burst of oral prednisone to help with the worst of the symptoms.  Reviewed causes and contributing factors of dyshidrotic eczema.  Recommend wearing gloves when doing outside work or specific tasks.  Recommend using lukewarm water and drying hands very thoroughly after each washing. - Ambulatory referral to  Dermatology - clobetasol cream (TEMOVATE) 0.05 %; Apply 1 application topically 2 (two) times daily.  Dispense: 30 g; Refill: 0 - predniSONE (DELTASONE) 50 MG tablet; Take 1 tablet (50 mg total) by mouth daily.  Dispense: 5 tablet; Refill: 0  2. Rash Unclear etiology of his scattered maculopapular rash.  Sending in a prednisone burst.  Ultimately I think he needs to see dermatology for further evaluation so referral entered today. - Ambulatory referral to Dermatology - clobetasol cream (TEMOVATE) 0.05 %; Apply 1 application topically 2 (two) times daily.  Dispense: 30 g; Refill: 0 - predniSONE (DELTASONE) 50 MG tablet; Take 1 tablet (50 mg total) by mouth daily.  Dispense: 5 tablet; Refill: 0  Return if symptoms worsen or fail to improve. ___________________________________________ Thayer Ohm, DNP, APRN, FNP-BC Primary Care and Sports Medicine [facility] [place]

## 2020-12-10 ENCOUNTER — Other Ambulatory Visit: Payer: Self-pay

## 2020-12-10 ENCOUNTER — Emergency Department
Admission: RE | Admit: 2020-12-10 | Discharge: 2020-12-10 | Disposition: A | Discharge status: Home / Self Care | Payer: Managed Care, Other (non HMO) | Source: Ambulatory Visit | Attending: Family Medicine | Admitting: Family Medicine

## 2020-12-10 VITALS — BP 154/103 | HR 82 | Temp 98.4°F | Resp 16

## 2020-12-10 DIAGNOSIS — J22 Unspecified acute lower respiratory infection: Secondary | ICD-10-CM

## 2020-12-10 MED ORDER — AMOXICILLIN 875 MG PO TABS: 875.0000 mg | ORAL_TABLET | Freq: Two times a day (BID) | ORAL | 0 refills | Status: DC

## 2020-12-10 NOTE — ED Provider Notes (Signed)
[facility]    CSN: [id number] Arrival date & time: 12/10/20  1250      History   Chief Complaint Chief Complaint  Patient presents with   Appointment    1:00PM   Cough    HPI William Marshall is a 45 y.o. male.   HPI Sick for 8 days.  Cough.  Sinus congestion.  Postnasal drip.  Fatigue.  COVID testing negative.  States he is prone to sinus infections.  Feels like he has a sinus infection at this time.  He has been using over-the-counter medications.  He states that this has not helped.  He has Flonase, Claritin, Sudafed  Past Medical History:  Diagnosis Date   GERD (gastroesophageal reflux disease)    Gout    History of osteomyelitis as a child     Patient Active Problem List   Diagnosis Date Noted   Enlarged lymph node 01/10/2020   Cough 01/10/2020   Rash 01/10/2020   Flank pain 11/08/2019   Laceration of other muscle(s) and tendon(s) at lower leg level, right leg, initial encounter 11/07/2019   Laceration of leg 09/22/2019   Podagra 03/22/2019   Tinea versicolor 08/12/2017   Allergic rhinitis 09/05/2015   Personal history of other diseases of the musculoskeletal system and connective tissue 09/05/2015   History of fracture of vertebra 09/05/2015   Chronic cough 09/05/2015   Transaminitis 09/05/2015    History reviewed. No pertinent surgical history.     Home Medications    Prior to Admission medications   Medication Sig Start Date End Date Taking? Authorizing Provider  allopurinol (ZYLOPRIM) 100 MG tablet TAKE 1 TABLET BY MOUTH EVERY DAY 03/13/20  Yes Everrett Coombe, DO  amoxicillin (AMOXIL) 875 MG tablet Take 1 tablet (875 mg total) by mouth 2 (two) times daily for 10 days. 12/10/20 12/20/20 Yes Eustace Moore, MD  Butenafine HCl (LOTRIMIN ULTRA) 1 % cream Apply thin layer twice per day. 01/10/20  Yes Everrett Coombe, DO  clobetasol cream (TEMOVATE) 0.05 % Apply 1 application topically 2 (two) times daily. 09/01/20  Yes Christen Butter, NP   levocetirizine (XYZAL) 5 MG tablet Take 5 mg by mouth every evening.   Yes [provider]  loratadine (CLARITIN) 10 MG tablet Take by mouth.   Yes [provider]  Fluticasone Propionate (FLONASE NA) Place into the nose.    [provider]  indomethacin (INDOCIN) 50 MG capsule Take 1 capsule (50 mg total) by mouth 3 (three) times daily with meals. 03/19/19   Christen Butter, NP  MITIGARE 0.6 MG CAPS TAKE 1 TABLET (0.6 MG TOTAL) BY MOUTH 2 (TWO) TIMES DAILY. 05/17/20   Everrett Coombe, DO  tamsulosin (FLOMAX) 0.4 MG CAPS capsule TAKE 1 CAPSULE BY MOUTH EVERY DAY 12/01/19   Everrett Coombe, DO  triamcinolone (KENALOG) 0.1 % Apply 1 application topically 2 (two) times daily. 04/25/20   Christen Butter, NP    Family History Family History  Problem Relation Age of Onset   Hypertension Mother    Diabetes Mother    Kidney disease Maternal Grandmother     Social History Social History   Tobacco Use   Smoking status: Never   Smokeless tobacco: Never  Vaping Use   Vaping Use: Never used  Substance Use Topics   Alcohol use: Yes    Alcohol/week: 0.0 standard drinks   Drug use: No     Allergies   Patient has no known allergies.   Review of Systems Review of Systems  Physical Exam Triage Vital Signs ED Triage Vitals  Enc Vitals Group     BP 12/10/20 1323 (!) 154/103     Pulse Rate 12/10/20 1323 82     Resp 12/10/20 1323 16     Temp 12/10/20 1323 98.4 F (36.9 C)     Temp Source 12/10/20 1323 Oral     SpO2 12/10/20 1323 97 %     Weight --      Height --      Head Circumference --      Peak Flow --      Pain Score 12/10/20 1320 0     Pain Loc --      Pain Edu? --      Excl. in GC? --    No data found.  Updated Vital Signs BP (!) 154/103 (BP Location: Left Arm)   Pulse 82   Temp 98.4 F (36.9 C) (Oral)   Resp 16   SpO2 97%      Physical Exam Constitutional:      General: He is not in acute distress.    Appearance: He is well-developed.  HENT:      Head: Normocephalic and atraumatic.     Right Ear: Tympanic membrane and ear canal normal.     Left Ear: Tympanic membrane and ear canal normal.     Nose:     Comments: Nasal membranes swollen and red.  Thick postnasal drip is noted.  Sinuses are tender in the maxillary region. Eyes:     Conjunctiva/sclera: Conjunctivae normal.     Pupils: Pupils are equal, round, and reactive to light.  Cardiovascular:     Rate and Rhythm: Normal rate and regular rhythm.     Heart sounds: Normal heart sounds.  Pulmonary:     Effort: Pulmonary effort is normal. No respiratory distress.     Breath sounds: No wheezing or rales.  Abdominal:     General: There is no distension.     Palpations: Abdomen is soft.  Musculoskeletal:        General: Normal range of motion.     Cervical back: Normal range of motion.  Lymphadenopathy:     Cervical: No cervical adenopathy.  Skin:    General: Skin is warm and dry.  Neurological:     Mental Status: He is alert.     UC Treatments / Results  Labs (all labs ordered are listed, but only abnormal results are displayed) Labs Reviewed - No data to display  EKG   Radiology No results found.  Procedures Procedures (including critical care time)  Medications Ordered in UC Medications - No data to display  Initial Impression / Assessment and Plan / UC Course  I have reviewed the triage vital signs and the nursing notes.  Pertinent labs & imaging results that were available during my care of the patient were reviewed by me and considered in my medical decision making (see chart for details).      Final Clinical Impressions(s) / UC Diagnoses   Final diagnoses:  LRTI (lower respiratory tract infection)     Discharge Instructions      May continue over-the-counter cough cold medicine Add amoxicillin 2 times a day  drink lots of fluids  return as needed     ED Prescriptions     Medication Sig Dispense Auth. Provider   amoxicillin  (AMOXIL) 875 MG tablet Take 1 tablet (875 mg total) by mouth 2 (two) times daily for 10 days. 20 tablet  Eustace Moore, MD      PDMP not reviewed this encounter.   Eustace Moore, MD 12/10/20 [phone]

## 2020-12-10 NOTE — ED Triage Notes (Signed)
Patient presents to Urgent Care with complaints of nasal congestion and cough since 1 week ago. Patient reports nasal congestion, cough-green mucus, sinus pressure.  Denies any fever or chills. Took Sudafed- 5 days with no relief, Dayquil, Nyquil.

## 2020-12-10 NOTE — Discharge Instructions (Signed)
May continue over-the-counter cough cold medicine Add amoxicillin 2 times a day  drink lots of fluids  return as needed

## 2020-12-20 ENCOUNTER — Other Ambulatory Visit: Payer: Self-pay

## 2020-12-20 ENCOUNTER — Ambulatory Visit: Payer: Managed Care, Other (non HMO) | Admitting: Family Medicine

## 2020-12-20 ENCOUNTER — Encounter: Payer: Self-pay | Admitting: Family Medicine

## 2020-12-20 DIAGNOSIS — J40 Bronchitis, not specified as acute or chronic: Secondary | ICD-10-CM | POA: Insufficient documentation

## 2020-12-20 DIAGNOSIS — J209 Acute bronchitis, unspecified: Secondary | ICD-10-CM

## 2020-12-20 MED ORDER — HYDROCOD POLST-CPM POLST ER 10-8 MG/5ML PO SUER: 5.0000 mL | Freq: Two times a day (BID) | ORAL | 0 refills | Status: DC | PRN

## 2020-12-20 MED ORDER — PREDNISONE 50 MG PO TABS: ORAL_TABLET | ORAL | 0 refills | Status: DC

## 2020-12-20 MED ORDER — DOXYCYCLINE HYCLATE 100 MG PO TABS: 100.0000 mg | ORAL_TABLET | Freq: Two times a day (BID) | ORAL | 0 refills | Status: AC

## 2020-12-20 NOTE — Progress Notes (Signed)
William Marshall - 45 y.o. male MRN [id number]  Date of birth: [birth year]  Subjective Chief Complaint  Patient presents with   Cough    HPI William Marshall is a 45 year old male here today for follow-up of recent urgent care visit.  Seen at the urgent care approximately 1-1/2 weeks ago for cough, sinus congestion, postnasal drainage and fatigue.  COVID testing was negative at that time.  Amoxicillin was added for coverage of sinusitis.  He reports that symptoms have not really improved since that time.  He continues to have deep cough with some mild shortness of breath.  He has not had fever, chills, nausea, vomiting.  He denies chest pain.  ROS:  A comprehensive ROS was completed and negative except as noted per HPI  No Known Allergies  Past Medical History:  Diagnosis Date   GERD (gastroesophageal reflux disease)    Gout    History of osteomyelitis as a child     History reviewed. No pertinent surgical history.  Social History   Socioeconomic History   Marital status: Married    Spouse name: Not on file   Number of children: Not on file   Years of education: Not on file   Highest education level: Not on file  Occupational History   Not on file  Tobacco Use   Smoking status: Never   Smokeless tobacco: Never  Vaping Use   Vaping Use: Never used  Substance and Sexual Activity   Alcohol use: Yes    Alcohol/week: 0.0 standard drinks   Drug use: No   Sexual activity: Not on file  Other Topics Concern   Not on file  Social History Narrative   Not on file   Social Determinants of Health   Financial Resource Strain: Not on file  Food Insecurity: Not on file  Transportation Needs: Not on file  Physical Activity: Not on file  Stress: Not on file  Social Connections: Not on file    Family History  Problem Relation Age of Onset   Hypertension Mother    Diabetes Mother    Kidney disease Maternal Grandmother     Health Maintenance  Topic Date Due   COVID-19 Vaccine (3 -  Booster for Pfizer series) 06/01/2020   INFLUENZA VACCINE  10/09/2020   TETANUS/TDAP  03/18/2027   COLONOSCOPY (Pts 45-8yrs Insurance coverage will need to be confirmed)  05/20/2030   Hepatitis C Screening  Completed   HIV Screening  Completed   HPV VACCINES  Aged Out     ----------------------------------------------------------------------------------------------------------------------------------------------------------------------------------------------------------------- Physical Exam BP (!) 143/81 (BP Location: Left Arm, Patient Position: Sitting, Cuff Size: Large)   Pulse 99   Temp 98.2 F (36.8 C) (Oral)   Ht 6\' 3"  (1.905 m)   Wt 272 lb (123.4 kg)   SpO2 96%   BMI 34.00 kg/m   Physical Exam Constitutional:      Appearance: Normal appearance.  HENT:     Head: Normocephalic and atraumatic.  Eyes:     General: No scleral icterus. Cardiovascular:     Rate and Rhythm: Normal rate and regular rhythm.  Pulmonary:     Effort: Pulmonary effort is normal.     Breath sounds: Normal breath sounds.  Musculoskeletal:     Cervical back: Neck supple.  Skin:    General: Skin is warm and dry.  Neurological:     General: No focal deficit present.     Mental Status: He is alert.  Psychiatric:        Mood  and Affect: Mood normal.        Behavior: Behavior normal.    ------------------------------------------------------------------------------------------------------------------------------------------------------------------------------------------------------------------- Assessment and Plan  Acute bronchitis No significant improvement with amoxicillin.  Changing to doxycycline and adding prednisone 50mg  daily x5 days.   Rx for tussionex cough syrup at night.  He may use delsym during the day.  Push fluids.  Humidifier for bedroom may be helpful. Call if symptoms fail to improve or worsen.    Meds ordered this encounter  Medications   predniSONE (DELTASONE) 50 MG tablet     Sig: Take 1 tab po daily x5 days    Dispense:  5 tablet    Refill:  0   doxycycline (VIBRA-TABS) 100 MG tablet    Sig: Take 1 tablet (100 mg total) by mouth 2 (two) times daily for 7 days.    Dispense:  14 tablet    Refill:  0   chlorpheniramine-HYDROcodone (TUSSIONEX PENNKINETIC ER) 10-8 MG/5ML SUER    Sig: Take 5 mLs by mouth every 12 (twelve) hours as needed for cough.    Dispense:  115 mL    Refill:  0    No follow-ups on file.    This visit occurred during the SARS-CoV-2 public health emergency.  Safety protocols were in place, including screening questions prior to the visit, additional usage of staff PPE, and extensive cleaning of exam room while observing appropriate contact time as indicated for disinfecting solutions.

## 2020-12-20 NOTE — Assessment & Plan Note (Signed)
No significant improvement with amoxicillin.  Changing to doxycycline and adding prednisone 50mg  daily x5 days.   Rx for tussionex cough syrup at night.  He may use delsym during the day.  Push fluids.  Humidifier for bedroom may be helpful. Call if symptoms fail to improve or worsen.

## 2020-12-20 NOTE — Patient Instructions (Signed)
Start doxycycline and prednisone Use cough syrup at night as needed.  You may use Delsym during the day if needed.  Humidifier/Nasal saline may be helpful.  If this continues to worsen let me know.

## 2020-12-28 ENCOUNTER — Other Ambulatory Visit: Payer: Self-pay | Admitting: Family Medicine

## 2020-12-28 MED ORDER — PREDNISONE 10 MG (48) PO TBPK: ORAL_TABLET | ORAL | 0 refills | Status: DC

## 2020-12-28 MED ORDER — HYDROCOD POLST-CPM POLST ER 10-8 MG/5ML PO SUER: 5.0000 mL | Freq: Two times a day (BID) | ORAL | 0 refills | Status: DC | PRN

## 2021-01-22 ENCOUNTER — Ambulatory Visit: Payer: Managed Care, Other (non HMO) | Admitting: Physician Assistant

## 2021-01-22 ENCOUNTER — Encounter: Payer: Self-pay | Admitting: Physician Assistant

## 2021-01-22 ENCOUNTER — Ambulatory Visit (INDEPENDENT_AMBULATORY_CARE_PROVIDER_SITE_OTHER): Payer: Managed Care, Other (non HMO)

## 2021-01-22 ENCOUNTER — Other Ambulatory Visit: Payer: Self-pay

## 2021-01-22 VITALS — BP 125/84 | HR 87 | Temp 97.6°F | Ht 75.0 in | Wt 277.0 lb

## 2021-01-22 DIAGNOSIS — S59912A Unspecified injury of left forearm, initial encounter: Secondary | ICD-10-CM

## 2021-01-22 DIAGNOSIS — M79632 Pain in left forearm: Secondary | ICD-10-CM

## 2021-01-22 DIAGNOSIS — J3489 Other specified disorders of nose and nasal sinuses: Secondary | ICD-10-CM

## 2021-01-22 DIAGNOSIS — R053 Chronic cough: Secondary | ICD-10-CM | POA: Diagnosis not present

## 2021-01-22 DIAGNOSIS — S4992XA Unspecified injury of left shoulder and upper arm, initial encounter: Secondary | ICD-10-CM | POA: Diagnosis not present

## 2021-01-22 MED ORDER — MONTELUKAST SODIUM 10 MG PO TABS: 10.0000 mg | ORAL_TABLET | Freq: Every day | ORAL | 3 refills | Status: DC

## 2021-01-22 NOTE — Progress Notes (Signed)
Subjective:    Patient ID: William Marshall, male    DOB: 1975-06-15, 45 y.o.   MRN: [id number]  HPI Pt is a 45 yo male with allergic rhinitis who presents to the clinic with chronic cough for at least 3 months. He most recently was given prednisone and doxy which helped but still left with sinus drainage. Seems to get worse in afternoons and evenings. He admits he works around dust, trees, grass all which he is allergic too. No trouble breathing. Hates nasal spray. Taking zyzal.   He also had an arm injury a few days ago. His left arm got caught between the leaf bag and mower seat. It drug him about 81ft before he was able to shut if off.   .. Active Ambulatory Problems    Diagnosis Date Noted   Allergic rhinitis 09/05/2015   Personal history of other diseases of the musculoskeletal system and connective tissue 09/05/2015   History of fracture of vertebra 09/05/2015   Chronic cough 09/05/2015   Transaminitis 09/05/2015   Tinea versicolor 08/12/2017   Podagra 03/22/2019   Laceration of leg 09/22/2019   Laceration of other muscle(s) and tendon(s) at lower leg level, right leg, initial encounter 11/07/2019   Flank pain 11/08/2019   Enlarged lymph node 01/10/2020   Cough 01/10/2020   Rash 01/10/2020   Acute bronchitis 12/20/2020   Sinus drainage 01/26/2021   Injury of left lower arm 01/26/2021   Resolved Ambulatory Problems    Diagnosis Date Noted   No Resolved Ambulatory Problems   Past Medical History:  Diagnosis Date   GERD (gastroesophageal reflux disease)    Gout    History of osteomyelitis as a child         Review of Systems See HPI.     Objective:   Physical Exam Vitals reviewed.  Constitutional:      Appearance: Normal appearance. He is obese.  HENT:     Head: Normocephalic.     Right Ear: Tympanic membrane, ear canal and external ear normal. There is no impacted cerumen.     Left Ear: Tympanic membrane, ear canal and external ear normal. There is no impacted  cerumen.     Nose: Nose normal. No congestion.     Mouth/Throat:     Mouth: Mucous membranes are moist.     Pharynx: No oropharyngeal exudate or posterior oropharyngeal erythema.  Eyes:     Conjunctiva/sclera: Conjunctivae normal.  Neck:     Vascular: No carotid bruit.  Cardiovascular:     Rate and Rhythm: Normal rate and regular rhythm.     Pulses: Normal pulses.     Heart sounds: Normal heart sounds.  Pulmonary:     Effort: Pulmonary effort is normal.     Breath sounds: Normal breath sounds.  Musculoskeletal:     Right lower leg: No edema.     Left lower leg: No edema.  Lymphadenopathy:     Cervical: No cervical adenopathy.  Neurological:     General: No focal deficit present.     Mental Status: He is alert and oriented to person, place, and time.  Psychiatric:        Mood and Affect: Mood normal.          Assessment & Plan:  Marland KitchenMarland KitchenGavon was seen today for nasal congestion.  Diagnoses and all orders for this visit:  Injury of left lower arm, initial encounter -     DG Forearm Left; Future  Sinus drainage -  montelukast (SINGULAIR) 10 MG tablet; Take 1 tablet (10 mg total) by mouth at bedtime.  Chronic cough -     montelukast (SINGULAIR) 10 MG tablet; Take 1 tablet (10 mg total) by mouth at bedtime.   No fracture on xray.  Likely bad contusion.  NSAIDs and ice.  Rest for the next few days.  Follow up as needed.   Chronic cough- start singulair. Continue flonase and xyzal. Consider spironmetry for cough.

## 2021-01-22 NOTE — Progress Notes (Signed)
No evidence of fracture. Bad contusion.

## 2021-01-26 DIAGNOSIS — S59912A Unspecified injury of left forearm, initial encounter: Secondary | ICD-10-CM | POA: Insufficient documentation

## 2021-01-26 DIAGNOSIS — J3489 Other specified disorders of nose and nasal sinuses: Secondary | ICD-10-CM | POA: Insufficient documentation

## 2021-02-07 ENCOUNTER — Other Ambulatory Visit: Payer: Self-pay | Admitting: Family Medicine

## 2021-02-07 DIAGNOSIS — M109 Gout, unspecified: Secondary | ICD-10-CM

## 2021-02-12 ENCOUNTER — Encounter: Payer: Self-pay | Admitting: Family Medicine

## 2021-02-12 ENCOUNTER — Ambulatory Visit: Payer: Managed Care, Other (non HMO) | Admitting: Family Medicine

## 2021-02-12 ENCOUNTER — Other Ambulatory Visit: Payer: Self-pay

## 2021-02-12 VITALS — BP 137/87 | HR 109 | Resp 18 | Ht 75.0 in | Wt 271.0 lb

## 2021-02-12 DIAGNOSIS — J329 Chronic sinusitis, unspecified: Secondary | ICD-10-CM

## 2021-02-12 DIAGNOSIS — J101 Influenza due to other identified influenza virus with other respiratory manifestations: Secondary | ICD-10-CM | POA: Diagnosis not present

## 2021-02-12 DIAGNOSIS — J40 Bronchitis, not specified as acute or chronic: Secondary | ICD-10-CM

## 2021-02-12 LAB — POCT INFLUENZA A/B
Influenza A, POC: POSITIVE — AB
Influenza B, POC: NEGATIVE

## 2021-02-12 MED ORDER — HYDROCODONE BIT-HOMATROP MBR 5-1.5 MG/5ML PO SOLN: 5.0000 mL | Freq: Three times a day (TID) | ORAL | 0 refills | Status: DC | PRN

## 2021-02-12 MED ORDER — PREDNISONE 20 MG PO TABS: ORAL_TABLET | ORAL | 0 refills | Status: DC

## 2021-02-12 MED ORDER — OSELTAMIVIR PHOSPHATE 75 MG PO CAPS: 75.0000 mg | ORAL_CAPSULE | Freq: Two times a day (BID) | ORAL | 0 refills | Status: DC

## 2021-02-12 NOTE — Progress Notes (Deleted)
Patient takes Indomethacin as needed only.

## 2021-02-12 NOTE — Progress Notes (Signed)
Acute Office Visit  Subjective:    Patient ID: William Marshall, male    DOB: [birth year], 45 y.o.   MRN: [id number]  Chief Complaint  Patient presents with   Cough    Productive cough, head pressure, bodyaches, 3 days. At home Covid test negative yesterday     HPI Patient is in today for Cough that is worse x 3 days.  He says he has a history of a chronic cough that is been going on for years but in the last 3 days its been significantly worse its been keeping him awake.  He denies any fever or chills his sinuses feel like they are completely full and like they are burning.  He feels like his neck is actually swollen he has had no appetite.  He did do a COVID test at home and it was negative he has not had any GI symptoms his right ear has started hurting today.  Past Medical History:  Diagnosis Date   GERD (gastroesophageal reflux disease)    Gout    History of osteomyelitis as a child     No past surgical history on file.  Family History  Problem Relation Age of Onset   Hypertension Mother    Diabetes Mother    Kidney disease Maternal Grandmother     Social History   Socioeconomic History   Marital status: Married    Spouse name: Not on file   Number of children: Not on file   Years of education: Not on file   Highest education level: Not on file  Occupational History   Not on file  Tobacco Use   Smoking status: Never   Smokeless tobacco: Never  Vaping Use   Vaping Use: Never used  Substance and Sexual Activity   Alcohol use: Yes    Alcohol/week: 0.0 standard drinks   Drug use: No   Sexual activity: Not on file  Other Topics Concern   Not on file  Social History Narrative   Not on file   Social Determinants of Health   Financial Resource Strain: Not on file  Food Insecurity: Not on file  Transportation Needs: Not on file  Physical Activity: Not on file  Stress: Not on file  Social Connections: Not on file  Intimate Partner Violence: Not on file     Outpatient Medications Prior to Visit  Medication Sig Dispense Refill   allopurinol (ZYLOPRIM) 100 MG tablet TAKE 1 TABLET BY MOUTH EVERY DAY 90 tablet 3   indomethacin (INDOCIN) 50 MG capsule Take 1 capsule (50 mg total) by mouth 3 (three) times daily with meals. 15 capsule 0   levocetirizine (XYZAL) 5 MG tablet Take 5 mg by mouth every evening.     montelukast (SINGULAIR) 10 MG tablet Take 1 tablet (10 mg total) by mouth at bedtime. 90 tablet 3   loratadine (CLARITIN) 10 MG tablet Take by mouth.     Fluticasone Propionate (FLONASE NA) Place into the nose. (Patient not taking: Reported on 02/12/2021)     MITIGARE 0.6 MG CAPS TAKE 1 TABLET (0.6 MG TOTAL) BY MOUTH 2 (TWO) TIMES DAILY. (Patient not taking: Reported on 02/12/2021) 60 capsule 2   pantoprazole (PROTONIX) 40 MG tablet Take 40 mg by mouth every morning.     No facility-administered medications prior to visit.    No Known Allergies  Review of Systems     Objective:    Physical Exam Constitutional:      Appearance: He is well-developed.  HENT:     Head: Normocephalic and atraumatic.     Right Ear: External ear normal.     Left Ear: External ear normal.     Nose: Nose normal.  Eyes:     Conjunctiva/sclera: Conjunctivae normal.     Pupils: Pupils are equal, round, and reactive to light.  Neck:     Thyroid: No thyromegaly.     Comments: Tender bilaterally but no palpable lymph nodes. Cardiovascular:     Rate and Rhythm: Normal rate.     Heart sounds: Normal heart sounds.  Pulmonary:     Effort: Pulmonary effort is normal.     Breath sounds: Normal breath sounds.  Musculoskeletal:     Cervical back: Neck supple. Tenderness present.  Lymphadenopathy:     Cervical: No cervical adenopathy.  Skin:    General: Skin is warm and dry.  Neurological:     Mental Status: He is alert and oriented to person, place, and time.    BP 137/87   Pulse (!) 109   Resp 18   Ht 6\' 3"  (1.905 m)   Wt 271 lb 0.6 oz (122.9 kg)    BMI 33.88 kg/m  Wt Readings from Last 3 Encounters:  02/12/21 271 lb 0.6 oz (122.9 kg)  01/22/21 277 lb (125.6 kg)  12/20/20 272 lb (123.4 kg)    Health Maintenance Due  Topic Date Due   COVID-19 Vaccine (3 - Booster for Pfizer series) 02/27/2020   INFLUENZA VACCINE  10/09/2020    There are no preventive care reminders to display for this patient.   Lab Results  Component Value Date   TSH 2.12 05/04/2020   Lab Results  Component Value Date   WBC 7.6 05/04/2020   HGB 15.6 05/04/2020   HCT 47 05/04/2020   MCV 95.2 01/10/2020   PLT 295 05/04/2020   Lab Results  Component Value Date   NA 139 05/04/2020   K 4.5 05/04/2020   CO2 21 05/04/2020   GLUCOSE 89 11/14/2017   BUN 12 05/04/2020   CREATININE 1.2 05/04/2020   BILITOT 0.5 11/14/2017   ALKPHOS 81 05/04/2020   AST 77 (A) 05/04/2020   ALT 227 (A) 05/04/2020   PROT 6.5 11/14/2017   ALBUMIN 4.6 05/04/2020   CALCIUM 9.5 05/04/2020   Lab Results  Component Value Date   CHOL 216 (A) 05/04/2020   Lab Results  Component Value Date   HDL 70 05/04/2020   Lab Results  Component Value Date   LDLCALC 135 05/04/2020   Lab Results  Component Value Date   TRIG 65 05/04/2020   No results found for: CHOLHDL No results found for: 05/06/2020     Assessment & Plan:   Problem List Items Addressed This Visit   None Visit Diagnoses     Sinobronchitis    -  Primary   Relevant Medications   predniSONE (DELTASONE) 20 MG tablet   oseltamivir (TAMIFLU) 75 MG capsule   HYDROcodone bit-homatropine (HYCODAN) 5-1.5 MG/5ML syrup   Other Relevant Orders   POCT Influenza A/B (Completed)   Influenza A       Relevant Medications   oseltamivir (TAMIFLU) 75 MG capsule   Other Relevant Orders   POCT Influenza A/B (Completed)        Meds ordered this encounter  Medications   predniSONE (DELTASONE) 20 MG tablet    Sig: Take 2 tablets (40 mg total) by mouth daily with breakfast for 4 days, THEN 1 tablet (20 mg total) daily  with breakfast for 3 days.    Dispense:  11 tablet    Refill:  0   oseltamivir (TAMIFLU) 75 MG capsule    Sig: Take 1 capsule (75 mg total) by mouth 2 (two) times daily.    Dispense:  10 capsule    Refill:  0   HYDROcodone bit-homatropine (HYCODAN) 5-1.5 MG/5ML syrup    Sig: Take 5 mLs by mouth every 8 (eight) hours as needed for cough.    Dispense:  75 mL    Refill:  0      Nani Gasser, MD

## 2021-02-18 ENCOUNTER — Encounter: Payer: Self-pay | Admitting: Family Medicine

## 2021-02-19 MED ORDER — AMOXICILLIN 875 MG PO TABS: 875.0000 mg | ORAL_TABLET | Freq: Two times a day (BID) | ORAL | 0 refills | Status: DC

## 2021-02-19 MED ORDER — AMOXICILLIN 875 MG PO TABS: 875.0000 mg | ORAL_TABLET | Freq: Two times a day (BID) | ORAL | 0 refills | Status: AC

## 2021-04-13 ENCOUNTER — Other Ambulatory Visit: Payer: Self-pay | Admitting: Physician Assistant

## 2021-04-13 DIAGNOSIS — Z20828 Contact with and (suspected) exposure to other viral communicable diseases: Secondary | ICD-10-CM

## 2021-04-13 MED ORDER — OSELTAMIVIR PHOSPHATE 75 MG PO CAPS: 75.0000 mg | ORAL_CAPSULE | Freq: Every day | ORAL | 0 refills | Status: DC

## 2021-04-13 NOTE — Progress Notes (Signed)
Tamiflu sent.

## 2021-05-08 ENCOUNTER — Other Ambulatory Visit: Payer: Self-pay

## 2021-05-08 ENCOUNTER — Ambulatory Visit: Payer: Managed Care, Other (non HMO) | Admitting: Family Medicine

## 2021-05-08 ENCOUNTER — Encounter: Payer: Self-pay | Admitting: Family Medicine

## 2021-05-08 DIAGNOSIS — J0140 Acute pansinusitis, unspecified: Secondary | ICD-10-CM | POA: Insufficient documentation

## 2021-05-08 MED ORDER — PREDNISONE 50 MG PO TABS: ORAL_TABLET | ORAL | 0 refills | Status: DC

## 2021-05-08 MED ORDER — AMOXICILLIN-POT CLAVULANATE 875-125 MG PO TABS: 1.0000 | ORAL_TABLET | Freq: Two times a day (BID) | ORAL | 0 refills | Status: DC

## 2021-05-08 NOTE — Progress Notes (Signed)
William Marshall - 46 y.o. male MRN [id number]  Date of birth: [birth year]  Subjective Chief Complaint  Patient presents with   Acute Visit   Cough   Sinusitis    HPI William Marshall is a 46 year old male here today with complaint of sinus pain and pressure, congestion, headaches and fatigue.  He does have mild cough as well.  Mucus is thick and yellow/green.  He has not had fever, chills, chest pain, shortness of breath or gastrointestinal symptoms.  He has been using over-the-counter medications without much relief.  He did take a COVID test at home which was negative.  ROS:  A comprehensive ROS was completed and negative except as noted per HPI  No Known Allergies  Past Medical History:  Diagnosis Date   GERD (gastroesophageal reflux disease)    Gout    History of osteomyelitis as a child     History reviewed. No pertinent surgical history.  Social History   Socioeconomic History   Marital status: Married    Spouse name: Not on file   Number of children: Not on file   Years of education: Not on file   Highest education level: Not on file  Occupational History   Not on file  Tobacco Use   Smoking status: Never   Smokeless tobacco: Never  Vaping Use   Vaping Use: Never used  Substance and Sexual Activity   Alcohol use: Yes    Alcohol/week: 0.0 standard drinks   Drug use: No   Sexual activity: Not on file  Other Topics Concern   Not on file  Social History Narrative   Not on file   Social Determinants of Health   Financial Resource Strain: Not on file  Food Insecurity: Not on file  Transportation Needs: Not on file  Physical Activity: Not on file  Stress: Not on file  Social Connections: Not on file    Family History  Problem Relation Age of Onset   Hypertension Mother    Diabetes Mother    Kidney disease Maternal Grandmother     Health Maintenance  Topic Date Due   COVID-19 Vaccine (3 - Booster for Pfizer series) 02/27/2020   INFLUENZA VACCINE  10/09/2020    TETANUS/TDAP  03/18/2027   COLONOSCOPY (Pts 45-12yrs Insurance coverage will need to be confirmed)  05/20/2030   Hepatitis C Screening  Completed   HIV Screening  Completed   HPV VACCINES  Aged Out     ----------------------------------------------------------------------------------------------------------------------------------------------------------------------------------------------------------------- Physical Exam BP 126/86    Pulse 90    Temp 98.5 F (36.9 C)    Ht 6\' 3"  (1.905 m)    Wt 277 lb (125.6 kg)    SpO2 97%    BMI 34.62 kg/m   Physical Exam Constitutional:      Appearance: Normal appearance.  Eyes:     General: No scleral icterus. Cardiovascular:     Rate and Rhythm: Normal rate and regular rhythm.  Pulmonary:     Effort: Pulmonary effort is normal.     Breath sounds: Normal breath sounds.  Musculoskeletal:     Cervical back: Neck supple.  Neurological:     General: No focal deficit present.     Mental Status: He is alert.  Psychiatric:        Mood and Affect: Mood normal.        Behavior: Behavior normal.    ------------------------------------------------------------------------------------------------------------------------------------------------------------------------------------------------------------------- Assessment and Plan  Acute pansinusitis Negative COVID at home.  Negative influenza testing in clinic today.  Adding course  of Augmentin and prednisone.  Recommend continued supportive care at home with increased fluids and over-the-counter symptom management.   Meds ordered this encounter  Medications   amoxicillin-clavulanate (AUGMENTIN) 875-125 MG tablet    Sig: Take 1 tablet by mouth 2 (two) times daily.    Dispense:  20 tablet    Refill:  0   predniSONE (DELTASONE) 50 MG tablet    Sig: Take 1 tab po daily x5 days.    Dispense:  5 tablet    Refill:  0    Return if symptoms worsen or fail to improve.    This visit occurred  during the SARS-CoV-2 public health emergency.  Safety protocols were in place, including screening questions prior to the visit, additional usage of staff PPE, and extensive cleaning of exam room while observing appropriate contact time as indicated for disinfecting solutions.

## 2021-05-08 NOTE — Patient Instructions (Signed)

## 2021-05-08 NOTE — Assessment & Plan Note (Signed)
Negative COVID at home.  Negative influenza testing in clinic today.  Adding course of Augmentin and prednisone.  Recommend continued supportive care at home with increased fluids and over-the-counter symptom management.

## 2021-10-16 NOTE — Progress Notes (Unsigned)
   Acute Office Visit  Subjective:     Patient ID: William Marshall, male    DOB: [birth year], 46 y.o.   MRN: [id number]  No chief complaint on file.   HPI Patient is in today for acute visit.  ROS      Objective:    There were no vitals taken for this visit. {Vitals History (Optional):23777}  Physical Exam  No results found for any visits on 10/17/21.      Assessment & Plan:   Problem List Items Addressed This Visit   None   No orders of the defined types were placed in this encounter.   No follow-ups on file.  Charlton Amor, DO

## 2021-10-17 ENCOUNTER — Ambulatory Visit: Payer: Managed Care, Other (non HMO) | Admitting: Family Medicine

## 2021-10-17 ENCOUNTER — Ambulatory Visit (INDEPENDENT_AMBULATORY_CARE_PROVIDER_SITE_OTHER): Payer: Managed Care, Other (non HMO)

## 2021-10-17 ENCOUNTER — Encounter: Payer: Self-pay | Admitting: Family Medicine

## 2021-10-17 VITALS — BP 128/85 | HR 79 | Ht 75.0 in | Wt 275.0 lb

## 2021-10-17 DIAGNOSIS — J40 Bronchitis, not specified as acute or chronic: Secondary | ICD-10-CM

## 2021-10-17 MED ORDER — ALBUTEROL SULFATE HFA 108 (90 BASE) MCG/ACT IN AERS: 2.0000 | INHALATION_SPRAY | Freq: Four times a day (QID) | RESPIRATORY_TRACT | 0 refills | Status: DC | PRN

## 2021-10-17 MED ORDER — GUAIFENESIN-CODEINE 100-10 MG/5ML PO SOLN: 5.0000 mL | Freq: Four times a day (QID) | ORAL | 0 refills | Status: DC | PRN

## 2021-10-17 MED ORDER — DOXYCYCLINE HYCLATE 100 MG PO TABS: 100.0000 mg | ORAL_TABLET | Freq: Two times a day (BID) | ORAL | 0 refills | Status: AC

## 2021-10-17 NOTE — Assessment & Plan Note (Addendum)
-   due to wheezing, cough believe pt has bronchitis - ordering CXR to rule out pna  - have sent in doxycycline to help decrease lung inflammation  - albuterol inhaler to help with wheezing  - cough syrup given--pmp checked with no red flags - recommend rest and fluids - told pt to follow up in one week if no better - CXR per my prelim read shows lungs are clear with no acute abnormalities;  awaiting official radiology read

## 2021-11-03 ENCOUNTER — Encounter: Payer: Self-pay | Admitting: Family Medicine

## 2021-11-06 ENCOUNTER — Ambulatory Visit: Payer: Managed Care, Other (non HMO) | Admitting: Family Medicine

## 2021-11-06 ENCOUNTER — Encounter: Payer: Self-pay | Admitting: Family Medicine

## 2021-11-06 VITALS — BP 126/81 | HR 69 | Temp 97.9°F | Ht 75.0 in | Wt 269.1 lb

## 2021-11-06 DIAGNOSIS — J069 Acute upper respiratory infection, unspecified: Secondary | ICD-10-CM

## 2021-11-06 MED ORDER — PREDNISONE 20 MG PO TABS: 40.0000 mg | ORAL_TABLET | Freq: Every day | ORAL | 0 refills | Status: DC

## 2021-11-06 NOTE — Progress Notes (Signed)
   Acute Office Visit  Subjective:     Patient ID: William Marshall, male    DOB: January 26, 1976, 46 y.o.   MRN: [id number]  Chief Complaint  Patient presents with   Sinusitis    HPI Patient is in today for sinus congestion and severe cough. Now neck pain from cough.  Taking Singulair and added Xyzal. Similar sxs and seen on 8/9 and treated with doxy.  He started to get better and then started feling worse aobut 5 days ago. Wife has been sick at well. Cough is mostly dry. Left eye was matted x 2 days   ROS      Objective:    BP 126/81 (BP Location: Left Arm, Patient Position: Sitting, Cuff Size: Large)   Pulse 69   Temp 97.9 F (36.6 C) (Oral)   Ht 6\' 3"  (1.905 m)   Wt 269 lb 1.9 oz (122.1 kg)   SpO2 98%   BMI 33.64 kg/m    Physical Exam Constitutional:      Appearance: He is well-developed.  HENT:     Head: Normocephalic and atraumatic.     Right Ear: External ear normal.     Left Ear: External ear normal.     Nose: Nose normal.  Eyes:     Conjunctiva/sclera: Conjunctivae normal.     Pupils: Pupils are equal, round, and reactive to light.  Neck:     Thyroid: No thyromegaly.  Cardiovascular:     Rate and Rhythm: Normal rate.     Heart sounds: Normal heart sounds.  Pulmonary:     Effort: Pulmonary effort is normal.     Breath sounds: Normal breath sounds.  Musculoskeletal:     Cervical back: Neck supple.  Lymphadenopathy:     Cervical: No cervical adenopathy.  Skin:    General: Skin is warm and dry.  Neurological:     Mental Status: He is alert and oriented to person, place, and time.     No results found for any visits on 11/06/21.      Assessment & Plan:   Problem List Items Addressed This Visit   None Visit Diagnoses     Viral upper respiratory tract infection    -  Primary      Upper respiratory infection-it is difficult to say if he has a secondary infection after starting to improve after the initial 1.  I do not see any indication of bacterial  infection at this point in time.  So we will treat with prednisone.  It sounds like he has had a lot of congestion but not a lot of actual drainage and feeling a little bit of swelling in his throat.  If not improving over the next few days then could consider trial of an antibiotic though he is already had 1 round at the beginning of the month.  Meds ordered this encounter  Medications   predniSONE (DELTASONE) 20 MG tablet    Sig: Take 2 tablets (40 mg total) by mouth daily with breakfast.    Dispense:  10 tablet    Refill:  0    No follow-ups on file.  11/08/21, MD

## 2021-11-07 ENCOUNTER — Ambulatory Visit: Payer: Managed Care, Other (non HMO) | Admitting: Family Medicine

## 2021-11-08 ENCOUNTER — Other Ambulatory Visit: Payer: Self-pay | Admitting: Family Medicine

## 2021-11-08 DIAGNOSIS — J40 Bronchitis, not specified as acute or chronic: Secondary | ICD-10-CM

## 2021-11-09 ENCOUNTER — Encounter: Payer: Self-pay | Admitting: Family Medicine

## 2021-11-12 ENCOUNTER — Ambulatory Visit: Payer: Managed Care, Other (non HMO)

## 2021-11-12 ENCOUNTER — Ambulatory Visit
Admission: RE | Admit: 2021-11-12 | Discharge: 2021-11-12 | Disposition: A | Discharge status: Home / Self Care | Payer: Managed Care, Other (non HMO) | Source: Ambulatory Visit | Attending: Family Medicine | Admitting: Family Medicine

## 2021-11-12 VITALS — BP 143/85 | HR 92 | Temp 99.5°F | Resp 16 | Ht 75.0 in | Wt 269.1 lb

## 2021-11-12 DIAGNOSIS — R059 Cough, unspecified: Secondary | ICD-10-CM

## 2021-11-12 DIAGNOSIS — R0602 Shortness of breath: Secondary | ICD-10-CM

## 2021-11-12 DIAGNOSIS — J3089 Other allergic rhinitis: Secondary | ICD-10-CM | POA: Diagnosis not present

## 2021-11-12 MED ORDER — METHYLPREDNISOLONE SODIUM SUCC 125 MG IJ SOLR
125.0000 mg | Freq: Once | INTRAMUSCULAR | Status: AC
  Administered 2021-11-12: 125 mg via INTRAMUSCULAR

## 2021-11-12 MED ORDER — PREDNISONE 20 MG PO TABS: ORAL_TABLET | ORAL | 0 refills | Status: DC

## 2021-11-12 NOTE — Discharge Instructions (Addendum)
Advised patient to take medication as directed with food to completion.  Advised beginning tomorrow morning Tuesday, 11/13/2021 take Allegra with prednisone daily for the next 5 days.  Encouraged patient to increase daily water intake while taking these medications.  Advised patient if symptoms worsen and/or unresolved please follow-up with PCP for advanced imaging of the chest preferably CT of chest with contrast with pulmonary consult would be advised.

## 2021-11-12 NOTE — ED Triage Notes (Signed)
Sick still haven't got over my cough getting very hoarse it just feeling fatigued have been to my doctor twice  Pt is a firefighter  Had an Xray 3 weeks ago  Sinuses are always irritated, has seen an allergist  Chronic cough x 1 month

## 2021-11-12 NOTE — ED Provider Notes (Addendum)
[facility]    CSN: [id number] Arrival date & time: 11/12/21  1358      History   Chief Complaint Chief Complaint  Patient presents with   Cough    HPI William Marshall is a 46 y.o. male.   HPI 46 year old male presents with continued cough for more than 3 weeks feels fatigued and reports seeing his PCP twice.  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  Reports chronic cough for over a month now.  PMH significant for obesity, bronchitis, and chronic cough.  Past Medical History:  Diagnosis Date   GERD (gastroesophageal reflux disease)    Gout    History of osteomyelitis as a child     Patient Active Problem List   Diagnosis Date Noted   Acute pansinusitis 05/08/2021   Sinus drainage 01/26/2021   Injury of left lower arm 01/26/2021   Bronchitis 12/20/2020   Enlarged lymph node 01/10/2020   Cough 01/10/2020   Rash 01/10/2020   Flank pain 11/08/2019   Laceration of other muscle(s) and tendon(s) at lower leg level, right leg, initial encounter 11/07/2019   Laceration of leg 09/22/2019   Podagra 03/22/2019   Tinea versicolor 08/12/2017   Allergic rhinitis 09/05/2015   Personal history of other diseases of the musculoskeletal system and connective tissue 09/05/2015   History of fracture of vertebra 09/05/2015   Chronic cough 09/05/2015   Transaminitis 09/05/2015    History reviewed. No pertinent surgical history.     Home Medications    Prior to Admission medications   Medication Sig Start Date End Date Taking? Authorizing Provider  albuterol (VENTOLIN HFA) 108 (90 Base) MCG/ACT inhaler Inhale 2 puffs into the lungs every 6 (six) hours as needed for wheezing or shortness of breath. 10/17/21   Charlton Amor, DO  allopurinol (ZYLOPRIM) 100 MG tablet TAKE 1 TABLET BY MOUTH EVERY DAY 02/07/21   Everrett Coombe, DO  Fluticasone Propionate (FLONASE NA) Place into the nose.    [provider]  guaiFENesin-codeine 100-10 MG/5ML syrup Take 5 mLs by mouth  every 6 (six) hours as needed for cough. Patient not taking: Reported on 11/06/2021 10/17/21   Charlton Amor, DO  indomethacin (INDOCIN) 50 MG capsule Take 1 capsule (50 mg total) by mouth 3 (three) times daily with meals. Patient not taking: Reported on 11/06/2021 03/19/19   Christen Butter, NP  levocetirizine (XYZAL) 5 MG tablet Take 5 mg by mouth every evening.    [provider]  MITIGARE 0.6 MG CAPS TAKE 1 TABLET (0.6 MG TOTAL) BY MOUTH 2 (TWO) TIMES DAILY. 05/17/20   Everrett Coombe, DO  montelukast (SINGULAIR) 10 MG tablet Take 1 tablet (10 mg total) by mouth at bedtime. 01/22/21   Breeback, Jade L, PA-C  pantoprazole (PROTONIX) 40 MG tablet Take 40 mg by mouth every morning. 01/22/21   [provider]    Family History Family History  Problem Relation Age of Onset   Hypertension Mother    Diabetes Mother    Kidney disease Maternal Grandmother     Social History Social History   Tobacco Use   Smoking status: Never   Smokeless tobacco: Never  Vaping Use   Vaping Use: Never used  Substance Use Topics   Alcohol use: Not Currently    Alcohol/week: 20.0 standard drinks of alcohol    Types: 20 Cans of beer per week   Drug use: No     Allergies   Patient has no known allergies.   Review of Systems  Review of Systems  HENT:  Positive for postnasal drip.   Respiratory:  Positive for cough.   All other systems reviewed and are negative.    Physical Exam Triage Vital Signs ED Triage Vitals  Enc Vitals Group     BP 11/12/21 1424 (!) 143/85     Pulse Rate 11/12/21 1424 92     Resp 11/12/21 1424 16     Temp 11/12/21 1424 99.5 F (37.5 C)     Temp Source 11/12/21 1424 Oral     SpO2 11/12/21 1424 97 %     Weight 11/12/21 1425 269 lb 1.9 oz (122.1 kg)     Height 11/12/21 1425 6\' 3"  (1.905 m)     Head Circumference --      Peak Flow --      Pain Score 11/12/21 1425 3     Pain Loc --      Pain Edu? --      Excl. in GC? --    No data found.  Updated Vital  Signs BP (!) 143/85 (BP Location: Left Arm)   Pulse 92   Temp 99.5 F (37.5 C) (Oral)   Resp 16   Ht 6\' 3"  (1.905 m)   Wt 269 lb 1.9 oz (122.1 kg)   SpO2 97%   BMI 33.64 kg/m   Physical Exam Vitals and nursing note reviewed.  Constitutional:      Appearance: Normal appearance. He is normal weight.  HENT:     Head: Normocephalic and atraumatic.     Right Ear: Tympanic membrane, ear canal and external ear normal.     Left Ear: Tympanic membrane, ear canal and external ear normal.     Mouth/Throat:     Mouth: Mucous membranes are moist.     Pharynx: Oropharynx is clear.     Comments: Significant amount of clear drainage of posterior oropharynx noted Cardiovascular:     Rate and Rhythm: Normal rate and regular rhythm.     Pulses: Normal pulses.  Pulmonary:     Effort: Pulmonary effort is normal.     Breath sounds: Normal breath sounds. No wheezing, rhonchi or rales.     Comments: Frequent nonproductive cough noted on exam Musculoskeletal:        General: Normal range of motion.     Cervical back: Normal range of motion and neck supple.  Skin:    General: Skin is warm and dry.  Neurological:     General: No focal deficit present.     Mental Status: He is alert and oriented to person, place, and time.      UC Treatments / Results  Labs (all labs ordered are listed, but only abnormal results are displayed) Labs Reviewed - No data to display  EKG   Radiology DG Chest 2 View  Result Date: 11/12/2021 CLINICAL DATA:  Cough and shortness of breath for 1 month. EXAM: CHEST - 2 VIEW COMPARISON:  10/17/2021 FINDINGS: The heart size and mediastinal contours are within normal limits. Both lungs are clear. The visualized skeletal structures are unremarkable. IMPRESSION: No active cardiopulmonary disease. Electronically Signed   By: 01/12/2022 M.D.   On: 11/12/2021 15:01    Procedures Procedures (including critical care time)  Medications Ordered in UC Medications   methylPREDNISolone sodium succinate (SOLU-MEDROL) 125 mg/2 mL injection 125 mg (125 mg Intramuscular Given 11/12/21 1538)    Initial Impression / Assessment and Plan / UC Course  I have reviewed the triage vital signs and  the nursing notes.  Pertinent labs & imaging results that were available during my care of the patient were reviewed by me and considered in my medical decision making (see chart for details).     MDM: 1.  Cough-IM Solu-Medrol 125 mg given once in clinic prior to discharge, Rx'd prednisone; 2.  Allergic rhinitis-Rx'd Allegra. Advised patient to take medication as directed with food to completion.  Advised beginning tomorrow morning Tuesday, 11/13/2021 take Allegra with prednisone daily for the next 5 days.  Encouraged patient to increase daily water intake while taking these medications.  Advised patient if symptoms worsen and/or unresolved please follow-up with PCP for advanced imaging of the chest preferably CT of chest with contrast with pulmonary consult would be advised.  Patient discharged home, hemodynamically stable. Final Clinical Impressions(s) / UC Diagnoses   Final diagnoses:  Cough, unspecified type  Allergic rhinitis due to other allergic trigger, unspecified seasonality     Discharge Instructions      Advised patient to take medication as directed with food to completion.  Advised beginning tomorrow morning Tuesday, 11/13/2021 take Allegra with prednisone daily for the next 5 days.  Encouraged patient to increase daily water intake while taking these medications.  Advised patient if symptoms worsen and/or unresolved please follow-up with PCP for advanced imaging of the chest preferably CT of chest with contrast with pulmonary consult would be advised.     ED Prescriptions   None    PDMP not reviewed this encounter.   Trevor Iha, FNP 11/12/21 1545    Trevor Iha, FNP 11/12/21 1552

## 2021-11-13 ENCOUNTER — Telehealth: Payer: Self-pay | Admitting: Emergency Medicine

## 2021-11-13 NOTE — Telephone Encounter (Signed)
LMTRC. Advised if patient is doing well, disregard the call.  Any questions or concerns, feel free to contact the office.

## 2021-11-15 ENCOUNTER — Encounter: Payer: Self-pay | Admitting: Family Medicine

## 2021-11-19 ENCOUNTER — Telehealth: Payer: Self-pay | Admitting: Family Medicine

## 2021-11-19 NOTE — Telephone Encounter (Signed)
Pt has been seen 3 times in the past 4 weeks in our office and urgent care. He just seen Urgent care and the Dr there sent over recommendations that patients PCP should order a CT scan because he is not getting any better. Pt is asking IF you Can you go ahead and order the CT scan since  he has already been recently seen for this in our office and in our urgent care? Please advise

## 2021-11-19 NOTE — Telephone Encounter (Signed)
I have scheduled pt to see Dr. Ashley Royalty this week

## 2021-11-19 NOTE — Telephone Encounter (Signed)
Really needs to see me as I have not seen him for this.  Urgent care suggested CT with contrast, however unclear what there reasoning for this would be.   May need to get spirometry as well.  Alternatively, I can go ahead and refer to pulmonology and let them decide which direction to go with this.    CM

## 2021-11-21 ENCOUNTER — Ambulatory Visit: Payer: Managed Care, Other (non HMO) | Admitting: Family Medicine

## 2021-11-21 ENCOUNTER — Encounter: Payer: Self-pay | Admitting: Family Medicine

## 2021-11-21 VITALS — BP 132/92 | HR 67 | Ht 75.0 in | Wt 275.1 lb

## 2021-11-21 DIAGNOSIS — R7989 Other specified abnormal findings of blood chemistry: Secondary | ICD-10-CM | POA: Diagnosis not present

## 2021-11-21 DIAGNOSIS — R059 Cough, unspecified: Secondary | ICD-10-CM

## 2021-11-21 DIAGNOSIS — R052 Subacute cough: Secondary | ICD-10-CM

## 2021-11-21 MED ORDER — AMOXICILLIN-POT CLAVULANATE 875-125 MG PO TABS: 1.0000 | ORAL_TABLET | Freq: Two times a day (BID) | ORAL | 0 refills | Status: DC

## 2021-11-21 MED ORDER — HYDROCODONE BIT-HOMATROP MBR 5-1.5 MG/5ML PO SOLN: 5.0000 mL | Freq: Three times a day (TID) | ORAL | 0 refills | Status: DC | PRN

## 2021-11-21 NOTE — Assessment & Plan Note (Addendum)
Exam is unremarkable.  Negative x-ray previously.  Checking BMP and D-dimer.  Plan to get CT scan.  If D-dimer is positive will obtain CT angio.  Adding course of Augmentin and cough syrup renewed

## 2021-11-21 NOTE — Progress Notes (Signed)
William Marshall - 46 y.o. male MRN [id number]  Date of birth: [birth year]  Subjective Chief Complaint  Patient presents with   Cough   Sinus Problem    HPI William Marshall is a 46 year old male here today for follow-up of cough.  He has been seen a couple times in the clinic as well as recently at the urgent care.  Completed course of doxycycline as well as a couple rounds of steroids without significant improvement.  Tessalon Perles have not been helpful.  Prescription cough syrup does help some.  He is typically able to sleep at night.  He does have some occasional shortness of breath.  He denies chest pain, fevers, chills or leg swelling.  ROS:  A comprehensive ROS was completed and negative except as noted per HPI  No Known Allergies  Past Medical History:  Diagnosis Date   GERD (gastroesophageal reflux disease)    Gout    History of osteomyelitis as a child     History reviewed. No pertinent surgical history.  Social History   Socioeconomic History   Marital status: Married    Spouse name: Not on file   Number of children: Not on file   Years of education: Not on file   Highest education level: Not on file  Occupational History   Not on file  Tobacco Use   Smoking status: Never   Smokeless tobacco: Never  Vaping Use   Vaping Use: Never used  Substance and Sexual Activity   Alcohol use: Not Currently    Alcohol/week: 20.0 standard drinks of alcohol    Types: 20 Cans of beer per week   Drug use: No   Sexual activity: Not on file  Other Topics Concern   Not on file  Social History Narrative   Not on file   Social Determinants of Health   Financial Resource Strain: Not on file  Food Insecurity: Not on file  Transportation Needs: Not on file  Physical Activity: Not on file  Stress: Not on file  Social Connections: Not on file    Family History  Problem Relation Age of Onset   Hypertension Mother    Diabetes Mother    Kidney disease Maternal Grandmother      Health Maintenance  Topic Date Due   COVID-19 Vaccine (3 - Pfizer series) 11/22/2021 (Originally 02/27/2020)   INFLUENZA VACCINE  06/09/2022 (Originally 10/09/2021)   TETANUS/TDAP  03/18/2027   COLONOSCOPY (Pts 45-59yrs Insurance coverage will need to be confirmed)  05/20/2030   Hepatitis C Screening  Completed   HIV Screening  Completed   HPV VACCINES  Aged Out     ----------------------------------------------------------------------------------------------------------------------------------------------------------------------------------------------------------------- Physical Exam BP (!) 132/92 (BP Location: Right Arm, Patient Position: Sitting, Cuff Size: Large)   Pulse 67   Ht 6\' 3"  (1.905 m)   Wt 275 lb 1.9 oz (124.8 kg)   SpO2 99%   BMI 34.39 kg/m   Physical Exam Constitutional:      Appearance: Normal appearance.  Eyes:     General: No scleral icterus. Cardiovascular:     Rate and Rhythm: Normal rate and regular rhythm.  Pulmonary:     Effort: Pulmonary effort is normal.     Breath sounds: Normal breath sounds.  Musculoskeletal:     Cervical back: Neck supple.  Neurological:     Mental Status: He is alert.  Psychiatric:        Mood and Affect: Mood normal.        Behavior: Behavior normal.     -------------------------------------------------------------------------------------------------------------------------------------------------------------------------------------------------------------------  Assessment and Plan  Subacute cough Exam is unremarkable.  Negative x-ray previously.  Checking BMP and D-dimer.  Plan to get CT scan.  If D-dimer is positive will obtain CT angio.  Adding course of Augmentin and cough syrup renewed    Meds ordered this encounter  Medications   amoxicillin-clavulanate (AUGMENTIN) 875-125 MG tablet    Sig: Take 1 tablet by mouth 2 (two) times daily.    Dispense:  20 tablet    Refill:  0   HYDROcodone bit-homatropine  (HYCODAN) 5-1.5 MG/5ML syrup    Sig: Take 5 mLs by mouth every 8 (eight) hours as needed for cough.    Dispense:  120 mL    Refill:  0    No follow-ups on file.    This visit occurred during the SARS-CoV-2 public health emergency.  Safety protocols were in place, including screening questions prior to the visit, additional usage of staff PPE, and extensive cleaning of exam room while observing appropriate contact time as indicated for disinfecting solutions.

## 2021-11-22 ENCOUNTER — Telehealth: Payer: Self-pay | Admitting: Family Medicine

## 2021-11-22 ENCOUNTER — Other Ambulatory Visit: Payer: Self-pay | Admitting: Family Medicine

## 2021-11-22 DIAGNOSIS — R0600 Dyspnea, unspecified: Secondary | ICD-10-CM

## 2021-11-22 DIAGNOSIS — R053 Chronic cough: Secondary | ICD-10-CM

## 2021-11-22 LAB — HEPATIC FUNCTION PANEL
AG Ratio: 1.9 (calc) (ref 1.0–2.5)
ALT: 110 U/L — ABNORMAL HIGH (ref 9–46)
AST: 39 U/L (ref 10–40)
Albumin: 4.4 g/dL (ref 3.6–5.1)
Alkaline phosphatase (APISO): 84 U/L (ref 36–130)
Bilirubin, Direct: 0.2 mg/dL (ref 0.0–0.2)
Globulin: 2.3 g/dL (calc) (ref 1.9–3.7)
Indirect Bilirubin: 0.8 mg/dL (calc) (ref 0.2–1.2)
Total Bilirubin: 1 mg/dL (ref 0.2–1.2)
Total Protein: 6.7 g/dL (ref 6.1–8.1)

## 2021-11-22 LAB — BRAIN NATRIURETIC PEPTIDE: Brain Natriuretic Peptide: <4 pg/mL (ref <100)

## 2021-11-22 LAB — D-DIMER, QUANTITATIVE: D-Dimer, Quant: 0.23 ug{FEU}/mL (ref <0.50)

## 2021-11-22 NOTE — Telephone Encounter (Signed)
Result note sent via mychart

## 2021-11-22 NOTE — Telephone Encounter (Signed)
Pts wife called and is concerned about her husband's health and would like for someone to call them back with lab results as soon as possible. Thank you

## 2021-11-23 ENCOUNTER — Telehealth: Payer: Self-pay | Admitting: Family Medicine

## 2021-11-23 NOTE — Telephone Encounter (Signed)
[employer] called. Pt wants location for his CT of chest.. sent over to [facility] due to large difference in cost, fax #[phone].

## 2021-11-26 ENCOUNTER — Telehealth: Payer: Self-pay

## 2021-11-26 NOTE — Telephone Encounter (Signed)
Task completed. Per patient's request MRI order faxed to [facility] at [phone]. Confirmation rec'd.

## 2021-11-26 NOTE — Telephone Encounter (Signed)
Rcvd vm from [place] @ [facility] stating William Marshall changed imaging locations to [place]. Requested that CT orders be faxed to [phone]).   Imaging order has been faxed at 514pm

## 2021-11-29 ENCOUNTER — Other Ambulatory Visit: Payer: Self-pay | Admitting: Family Medicine

## 2021-11-29 ENCOUNTER — Telehealth: Payer: Self-pay | Admitting: Neurology

## 2021-11-29 DIAGNOSIS — R059 Cough, unspecified: Secondary | ICD-10-CM

## 2021-11-29 NOTE — Telephone Encounter (Signed)
Please let patient know that CT of the chest looks ok.  Still likely to be post-viral cough.  Increasing the protonix for acid suppression may help.  He can take this twice per day.  I am also going to put in a referral to pulmonology.

## 2021-11-29 NOTE — Telephone Encounter (Signed)
Patient's wife called about CT results. CT done at [facility]. See report below. Still having symptoms. Please advise.   CT CHEST WO CONTRAST  Anatomical Region Laterality Modality  Chest -- Computed Tomography   Impression   No acute cardiopulmonary disease. No significant interstitial abnormality or chronic pulmonary disease identified. Narrative  CT CHEST WO CONTRAST, 11/27/2021 2:42 PM   INDICATION:  \ R05.3 Chronic cough \ R06.00 Dyspnea, unspecified type  COMPARISON: None   TECHNIQUE: Multislice axial images were obtained through the chest without administration of iodinated intravenous contrast material. Multi-planar reformatted images were generated for additional analysis. Nongated technique limits cardiac detail.   All CT scans at [facility] and [place] are performed using radiation dose optimization techniques as appropriate to a performed exam, including but not limited to one or more of the following: automatic exposure control, adjustment of the mA and/or kV according to patient size, use of iterative reconstruction technique. In addition, our institution participates in a radiation dose monitoring program to optimize patient radiation exposure.   FINDINGS:   Thoracic inlet/central airways: Thyroid normal. Airway patent.  Mediastinum/hila/axilla: No adenopathy.  Heart/vessels: Normal heart size. No pericardial effusion. Aorta normal in caliber.  Lungs/pleura: No focal bronchial wall thickening is appreciated. No bronchiectasis. No pulmonary parenchymal disease, pleural effusion, or pneumothorax.  Upper abdomen: Unremarkable.  Chest wall/MSK: Mild degenerative changes in the thoracic spine. Procedure Note  Phalen, Burlene Arnt, MD - 11/28/2021  Formatting of this note might be different from the original.  [place], 11/27/2021 2:42 PM   INDICATION:  \ R05.3 Chronic cough \ R06.00 Dyspnea, unspecified type   COMPARISON: None   TECHNIQUE: Multislice axial images were obtained through the chest without administration of iodinated intravenous contrast material. Multi-planar reformatted images were generated for additional analysis. Nongated technique limits cardiac detail.   All CT scans at [facility] and [place] are performed using radiation dose optimization techniques as appropriate to a performed exam, including but not limited to one or more of the following: automatic exposure control, adjustment of the mA and/or kV according to patient size, use of iterative reconstruction technique. In addition, our institution participates in a radiation dose monitoring program to optimize patient radiation exposure.   FINDINGS:   Thoracic inlet/central airways: Thyroid normal. Airway patent.  Mediastinum/hila/axilla: No adenopathy.  Heart/vessels: Normal heart size. No pericardial effusion. Aorta normal in caliber.  Lungs/pleura: No focal bronchial wall thickening is appreciated. No bronchiectasis. No pulmonary parenchymal disease, pleural effusion, or pneumothorax.  Upper abdomen: Unremarkable.  Chest wall/MSK: Mild degenerative changes in the thoracic spine.    CONCLUSION:   No acute cardiopulmonary disease. No significant interstitial abnormality or chronic pulmonary disease identified. Exam End: 11/27/21 14:42   Specimen Collected: 11/28/21 09:05 Last Resulted: 11/28/21 09:09  Received From: [facility]

## 2021-11-29 NOTE — Telephone Encounter (Signed)
Spoke with patient's wife about results, also lvm letting patient know recommendations. They will increase medication and follow up with pulmonology.

## 2021-12-17 ENCOUNTER — Telehealth: Payer: Self-pay | Admitting: General Practice

## 2021-12-17 NOTE — Telephone Encounter (Signed)
Transition Care Management Unsuccessful Follow-up Telephone Call  Date of discharge and from where:  12/15/21 from [facility]  Attempts:  1st Attempt  Reason for unsuccessful TCM follow-up call:  Left voice message    

## 2021-12-18 NOTE — Progress Notes (Signed)
Synopsis: Referred for cough by Luetta Nutting, DO  Subjective:   PATIENT ID: William Marshall GENDER: male DOB: [birth year], MRN: [id number]  Chief Complaint  Patient presents with   Consult    Cough, SOB, green mucus.   46yM with history of GERD, gout, AR,  referred for cough  Had covid-19 twice in 2020, symptomatically severe but wasn't hospitalized.  Over last few years he's had long episodes of bronchitis. Starting in July had long bout. He thinks it's related to sinus congestion. Very productive cough for about a week again recently. He does have sinus congestion takes allegra and singulair and constant postnasal drainage. Doesn't take flonase consistently. He has never had sinus surgery. Hasn't seen ENT recently. He did see allergy in past and did AIT but he didn't think it was all that helpful. He has had steroids for this cough and it probably is helpful. He has not been on ICS/LABA inhalers to his knowledge. He does have occasional heartburn, takes protonix and has been on it for about a year.   He does spirometry once yearly for fire dept.  Otherwise pertinent review of systems is negative.  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 He never smoked, MJ, vaping. Supposedly hypoallergenic dogs.  Past Medical History:  Diagnosis Date   GERD (gastroesophageal reflux disease)    Gout    History of osteomyelitis as a child      Family History  Problem Relation Age of Onset   Hypertension Mother    Diabetes Mother    Kidney disease Maternal Grandmother      No past surgical history on file.  Social History   Socioeconomic History   Marital status: Married    Spouse name: Not on file   Number of children: Not on file   Years of education: Not on file   Highest education level: Not on file  Occupational History   Not on file  Tobacco Use   Smoking  status: Never   Smokeless tobacco: Never  Vaping Use   Vaping Use: Never used  Substance and Sexual Activity   Alcohol use: Not Currently    Alcohol/week: 20.0 standard drinks of alcohol    Types: 20 Cans of beer per week   Drug use: No   Sexual activity: Not on file  Other Topics Concern   Not on file  Social History Narrative   Not on file   Social Determinants of Health   Financial Resource Strain: Not on file  Food Insecurity: Not on file  Transportation Needs: Not on file  Physical Activity: Not on file  Stress: Not on file  Social Connections: Not on file  Intimate Partner Violence: Not on file     No Known Allergies   Outpatient Medications Prior to Visit  Medication Sig Dispense Refill   albuterol (VENTOLIN HFA) 108 (90 Base) MCG/ACT inhaler Inhale 2 puffs into the lungs every 6 (six) hours as needed for wheezing or shortness of breath. 8 g 0   Fluticasone Propionate (FLONASE NA) Place into the nose.     levocetirizine (XYZAL) 5 MG tablet Take 5 mg by mouth every evening.     montelukast (SINGULAIR) 10 MG tablet Take 1 tablet (10 mg total) by mouth at bedtime. 90 tablet 3   allopurinol (ZYLOPRIM) 100 MG tablet TAKE 1 TABLET BY MOUTH EVERY  DAY 90 tablet 3   amoxicillin-clavulanate (AUGMENTIN) 875-125 MG tablet Take 1 tablet by mouth 2 (two) times daily. (Patient not taking: Reported on 12/20/2021) 20 tablet 0   HYDROcodone bit-homatropine (HYCODAN) 5-1.5 MG/5ML syrup Take 5 mLs by mouth every 8 (eight) hours as needed for cough. 120 mL 0   indomethacin (INDOCIN) 50 MG capsule Take 1 capsule (50 mg total) by mouth 3 (three) times daily with meals. 15 capsule 0   MITIGARE 0.6 MG CAPS TAKE 1 TABLET (0.6 MG TOTAL) BY MOUTH 2 (TWO) TIMES DAILY. 60 capsule 2   pantoprazole (PROTONIX) 40 MG tablet Take 40 mg by mouth every morning.     No facility-administered medications prior to visit.       Objective:   Physical Exam:  General appearance: 46 y.o., male, male, NAD,  conversant  Eyes: anicteric sclerae; PERRL, tracking appropriately HENT: NCAT; MMM Neck: Trachea midline; no lymphadenopathy, no JVD Lungs: CTAB, no crackles, no wheeze, with normal respiratory effort CV: RRR, no murmur  Abdomen: Soft, non-tender; non-distended, BS present  Extremities: No peripheral edema, warm Skin: Normal turgor and texture; no rash Psych: Appropriate affect Neuro: Alert and oriented to person and place, no focal deficit     Vitals:   12/20/21 0931  BP: (!) 130/100  Pulse: 80  SpO2: 97%  Weight: 273 lb 6.4 oz (124 kg)  Height: 6\' 3"  (1.905 m)   97% on RA BMI Readings from Last 3 Encounters:  12/20/21 34.17 kg/m  11/21/21 34.39 kg/m  11/12/21 33.64 kg/m   Wt Readings from Last 3 Encounters:  12/20/21 273 lb 6.4 oz (124 kg)  11/21/21 275 lb 1.9 oz (124.8 kg)  11/12/21 269 lb 1.9 oz (122.1 kg)     CBC    Component Value Date/Time   WBC 7.6 05/04/2020 0000   WBC 8.9 01/10/2020 1142   RBC 4.9 05/04/2020 0000   HGB 15.6 05/04/2020 0000   HCT 47 05/04/2020 0000   PLT 295 05/04/2020 0000   MCV 95.2 01/10/2020 1142   MCH 33.3 (H) 01/10/2020 1142   MCHC 34.9 01/10/2020 1142   RDW 13.1 01/10/2020 1142   LYMPHSABS 1,780 01/10/2020 1142   EOSABS 365 01/10/2020 1142   BASOSABS 71 01/10/2020 1142    Eos 365  Chest Imaging: CXR 11/12/21 reviewed by me unremarkable  CT Chest 11/27/21 OSH report reviewed by me - unremarkable  EGD 07/08/20 with irregular z line, esophageal dilation performed  Pulmonary Functions Testing Results:     No data to display             Assessment & Plan:   # Chronic cough # Recurrent/chronic sinusitis, bronchitis Has history of allergic rhinitis with specific allergy to grass/dust mites and mows the lawn regularly. He didn't respond great to AIT in past however. Not sure if there is underlying asthma. Certainly sounds like he frequently deals with sinus congestion/PND and this is probably playing role. OSH CT Chest  report doesn't clearly show any underlying structural lung disease like bronchiectasis or asthma mimic like sarcoid.   Plan: - labs today - I would recommend checking breathing tests for asthma (PFTs) ideally on same day as next visit - would continue to use neti pot daily with distilled water and salt/bicarb packets from [facility], gently clear nose of crusting and then flonase 1 spray each nostril daily till next visit - allegra and singulair daily - albuterol inhaler 1-2 puffs as needed or can use 5-20 minutes before heavy exertion or  mowing to see if this helps - would continue pantoprazole 40 mg daily 30 minutes before meal whether breakfast or dinner - ask medical records for cd with images [facility] to bring to next visit     Omar Person, MD [facility] 12/20/2021 10:05 AM

## 2021-12-19 NOTE — Telephone Encounter (Signed)
Transition Care Management Unsuccessful Follow-up Telephone Call  Date of discharge and from where:  12/15/21 from [facility]  Attempts:  2nd Attempt  Reason for unsuccessful TCM follow-up call:  Left voice message    

## 2021-12-20 ENCOUNTER — Encounter: Payer: Self-pay | Admitting: Student

## 2021-12-20 ENCOUNTER — Ambulatory Visit: Payer: Managed Care, Other (non HMO) | Admitting: Student

## 2021-12-20 VITALS — BP 130/100 | HR 80 | Ht 75.0 in | Wt 273.4 lb

## 2021-12-20 DIAGNOSIS — R053 Chronic cough: Secondary | ICD-10-CM

## 2021-12-20 LAB — CBC WITH DIFFERENTIAL/PLATELET
Basophils Absolute: 0.1 10*3/uL (ref 0.0–0.1)
Basophils Relative: 1.2 % (ref 0.0–3.0)
Eosinophils Absolute: 0.2 10*3/uL (ref 0.0–0.7)
Eosinophils Relative: 2.7 % (ref 0.0–5.0)
HCT: 43.6 % (ref 39.0–52.0)
Hemoglobin: 14.8 g/dL (ref 13.0–17.0)
Lymphocytes Relative: 21.9 % (ref 12.0–46.0)
Lymphs Abs: 1.8 10*3/uL (ref 0.7–4.0)
MCHC: 34 g/dL (ref 30.0–36.0)
MCV: 94.1 fl (ref 78.0–100.0)
Monocytes Absolute: 0.8 10*3/uL (ref 0.1–1.0)
Monocytes Relative: 9.8 % (ref 3.0–12.0)
Neutro Abs: 5.2 10*3/uL (ref 1.4–7.7)
Neutrophils Relative %: 64.4 % (ref 43.0–77.0)
Platelets: 224 10*3/uL (ref 150.0–400.0)
RBC: 4.63 Mil/uL (ref 4.22–5.81)
RDW: 13.9 % (ref 11.5–15.5)
WBC: 8.1 10*3/uL (ref 4.0–10.5)

## 2021-12-20 MED ORDER — FLUTICASONE PROPIONATE 50 MCG/ACT NA SUSP: 1.0000 | Freq: Every day | NASAL | 11 refills | Status: AC

## 2021-12-20 NOTE — Patient Instructions (Addendum)
-   labs today - I would recommend checking breathing tests for asthma (PFTs) ideally on same day as next visit - would continue to use neti pot daily with distilled water and salt/bicarb packets from [employer], gently clear nose of crusting and then flonase 1 spray each nostril daily till next visit - allegra and singulair daily - albuterol inhaler 1-2 puffs as needed or can use 5-20 minutes before heavy exertion or mowing to see if this helps - would continue pantoprazole 40 mg daily 30 minutes before meal whether breakfast or dinner - ask medical records for cd with images [facility] ct [facility] to bring to next visit

## 2021-12-21 LAB — IGE: IgE (Immunoglobulin E), Serum: 88 kU/L (ref <=114)

## 2021-12-21 NOTE — Telephone Encounter (Signed)
Transition Care Management Unsuccessful Follow-up Telephone Call  Date of discharge and from where:  12/15/21 from [facility]  Attempts:  3rd Attempt  Reason for unsuccessful TCM follow-up call:  Left voice message

## 2022-01-30 NOTE — Progress Notes (Signed)
Synopsis: Referred for cough by Everrett Coombe, DO  Subjective:   PATIENT ID: William Marshall GENDER: male DOB: December 13, 1975, MRN: [id number]  Chief Complaint  Patient presents with   Follow-up    PFT done today. Cough had improved and then worsened again over the past few days. He has a prod cough with green sputum. He has also had some wheezing and SOB.    46yM with history of GERD, gout, AR,  referred for cough  Had covid-19 twice in 2020, symptomatically severe but wasn't hospitalized.  Over last few years he's had long episodes of bronchitis. Starting in July had long bout. He thinks it's related to sinus congestion. Very productive cough for about a week again recently. He does have sinus congestion takes allegra and singulair and constant postnasal drainage. Doesn't take flonase consistently. He has never had sinus surgery. Hasn't seen ENT recently. He did see allergy in past and did AIT but he didn't think it was all that helpful. He has had steroids for this cough and it probably is helpful. He has not been on ICS/LABA inhalers to his knowledge. He does have occasional heartburn, takes protonix and has been on it for about a year.   He does spirometry once yearly for fire dept.  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 He never smoked, MJ, vaping. Supposedly hypoallergenic dogs.  Interval HPI  He says he was doing alright till about Tuesday developed scratchy throat and then cough has been worse. Very productive cough today. He does have sinus congestion. No fever. Lots of postnasal drainage.   Using nasal irrigation mainly after mowing.   Still taking allegra, singulair.    Otherwise pertinent review of systems is negative.  Past Medical History:  Diagnosis Date   GERD (gastroesophageal reflux disease)    Gout    History of osteomyelitis as a child       Family History  Problem Relation Age of Onset   Hypertension Mother    Diabetes Mother    Kidney disease Maternal Grandmother      No past surgical history on file.  Social History   Socioeconomic History   Marital status: Married    Spouse name: Not on file   Number of children: Not on file   Years of education: Not on file   Highest education level: Not on file  Occupational History   Not on file  Tobacco Use   Smoking status: Never   Smokeless tobacco: Never  Vaping Use   Vaping Use: Never used  Substance and Sexual Activity   Alcohol use: Not Currently    Alcohol/week: 20.0 standard drinks of alcohol    Types: 20 Cans of beer per week   Drug use: No   Sexual activity: Not on file  Other Topics Concern   Not on file  Social History Narrative   Not on file   Social Determinants of Health   Financial Resource Strain: Not on file  Food Insecurity: Not on file  Transportation Needs: Not on file  Physical Activity: Not on file  Stress: Not on file  Social Connections: Not on file  Intimate Partner Violence: Not on file     No Known Allergies   Outpatient Medications Prior to Visit  Medication Sig Dispense Refill   albuterol (VENTOLIN HFA) 108 (90 Base) MCG/ACT inhaler Inhale  2 puffs into the lungs every 6 (six) hours as needed for wheezing or shortness of breath. 8 g 0   allopurinol (ZYLOPRIM) 100 MG tablet TAKE 1 TABLET BY MOUTH EVERY DAY 90 tablet 3   fluticasone (FLONASE) 50 MCG/ACT nasal spray Place 1 spray into both nostrils daily. 16 g 11   MITIGARE 0.6 MG CAPS TAKE 1 TABLET (0.6 MG TOTAL) BY MOUTH 2 (TWO) TIMES DAILY. 60 capsule 2   montelukast (SINGULAIR) 10 MG tablet Take 10 mg by mouth at bedtime.     amoxicillin-clavulanate (AUGMENTIN) 875-125 MG tablet Take 1 tablet by mouth 2 (two) times daily. (Patient not taking: Reported on 12/20/2021) 20 tablet 0   HYDROcodone bit-homatropine (HYCODAN) 5-1.5 MG/5ML syrup Take 5 mLs by mouth every 8 (eight)  hours as needed for cough. 120 mL 0   indomethacin (INDOCIN) 50 MG capsule Take 1 capsule (50 mg total) by mouth 3 (three) times daily with meals. 15 capsule 0   levocetirizine (XYZAL) 5 MG tablet Take 5 mg by mouth every evening.     montelukast (SINGULAIR) 10 MG tablet Take 1 tablet (10 mg total) by mouth at bedtime. 90 tablet 3   pantoprazole (PROTONIX) 40 MG tablet Take 40 mg by mouth every morning.     No facility-administered medications prior to visit.       Objective:   Physical Exam:  General appearance: 46 y.o., male, NAD, conversant  Eyes: anicteric sclerae; PERRL, tracking appropriately HENT: NCAT; MMM Neck: Trachea midline; no lymphadenopathy, no JVD Lungs: CTAB, no crackles, no wheeze, with normal respiratory effort CV: RRR, no murmur  Abdomen: Soft, non-tender; non-distended, BS present  Extremities: No peripheral edema, warm Skin: Normal turgor and texture; no rash Psych: Appropriate affect Neuro: Alert and oriented to person and place, no focal deficit     Vitals:   02/01/22 1045  BP: 124/74  Pulse: 75  Temp: 98.6 F (37 C)  TempSrc: Oral  SpO2: 96%  Weight: 277 lb 6.4 oz (125.8 kg)  Height: 6\' 3"  (1.905 m)    96% on RA BMI Readings from Last 3 Encounters:  02/01/22 34.67 kg/m  12/20/21 34.17 kg/m  11/21/21 34.39 kg/m   Wt Readings from Last 3 Encounters:  02/01/22 277 lb 6.4 oz (125.8 kg)  12/20/21 273 lb 6.4 oz (124 kg)  11/21/21 275 lb 1.9 oz (124.8 kg)     CBC    Component Value Date/Time   WBC 8.1 12/20/2021 1039   RBC 4.63 12/20/2021 1039   HGB 14.8 12/20/2021 1039   HCT 43.6 12/20/2021 1039   PLT 224.0 12/20/2021 1039   MCV 94.1 12/20/2021 1039   MCH 33.3 (H) 01/10/2020 1142   MCHC 34.0 12/20/2021 1039   RDW 13.9 12/20/2021 1039   LYMPHSABS 1.8 12/20/2021 1039   MONOABS 0.8 12/20/2021 1039   EOSABS 0.2 12/20/2021 1039   BASOSABS 0.1 12/20/2021 1039    Eos 365  Chest Imaging: CXR 11/12/21 reviewed by me  unremarkable  CT Chest 11/27/21 OSH report reviewed by me - unremarkable  EGD 07/08/20 with irregular z line, esophageal dilation performed  Pulmonary Functions Testing Results:    Latest Ref Rng & Units 02/01/2022    9:44 AM  PFT Results  FVC-Pre L 5.16  P  FVC-Predicted Pre % 86  P  FVC-Post L 5.06  P  FVC-Predicted Post % 84  P  Pre FEV1/FVC % % 72  P  Post FEV1/FCV % % 75  P  FEV1-Pre L  3.70  P  FEV1-Predicted Pre % 79  P  FEV1-Post L 3.81  P    P Preliminary result   Normal pre/post BD spiro    Assessment & Plan:   # Chronic cough # Recurrent/chronic sinusitis, bronchitis Has history of allergic rhinitis with specific allergy to grass/dust mites and mows the lawn regularly. He didn't respond great to AIT in past however. Not sure if there is underlying asthma. Certainly sounds like he frequently deals with sinus congestion/PND and this is probably playing role. OSH CT Chest reviewed by me stone cold normal.   Plan: - prednisone taper, augmentin  - I'll send message to our pharmacists to verify least expensive LABA/ICS inhaler for you and then will send you my chart message and prescription once I hear back - would continue to use neti pot daily with distilled water and salt/bicarb packets from neilmed, gently clear nose of crusting and then flonase 1 spray each nostril daily till next visit - allegra and singulair daily - albuterol inhaler 1-2 puffs as needed or can use 5-20 minutes before heavy exertion or mowing to see if this helps - would continue anti reflux medicine 30 minutes before meal whether breakfast or dinner - if no better next visit we can consider referral to ENT, biologic for eosinophilic rhinitis and/or asthma, or referral again to allergy      Omar Person, MD [facility] 02/01/2022 10:58 AM

## 2022-02-01 ENCOUNTER — Ambulatory Visit: Payer: Managed Care, Other (non HMO) | Admitting: Student

## 2022-02-01 ENCOUNTER — Encounter: Payer: Self-pay | Admitting: Student

## 2022-02-01 ENCOUNTER — Other Ambulatory Visit (HOSPITAL_COMMUNITY): Payer: Self-pay

## 2022-02-01 ENCOUNTER — Telehealth: Payer: Self-pay | Admitting: Student

## 2022-02-01 VITALS — BP 124/74 | HR 75 | Temp 98.6°F | Ht 75.0 in | Wt 277.4 lb

## 2022-02-01 DIAGNOSIS — R053 Chronic cough: Secondary | ICD-10-CM

## 2022-02-01 LAB — PULMONARY FUNCTION TEST
FEF 25-75 Post: 3.01 L/sec
FEF 25-75 Pre: 2.92 L/sec
FEF2575-%Change-Post: 3 %
FEF2575-%Pred-Post: 74 %
FEF2575-%Pred-Pre: 71 %
FEV1-%Change-Post: 3 %
FEV1-%Pred-Post: 82 %
FEV1-%Pred-Pre: 79 %
FEV1-Post: 3.81 L
FEV1-Pre: 3.7 L
FEV1FVC-%Change-Post: 5 %
FEV1FVC-%Pred-Pre: 91 %
FEV6-%Change-Post: -1 %
FEV6-%Pred-Post: 87 %
FEV6-%Pred-Pre: 88 %
FEV6-Post: 5.06 L
FEV6-Pre: 5.13 L
FEV6FVC-%Change-Post: 0 %
FEV6FVC-%Pred-Post: 103 %
FEV6FVC-%Pred-Pre: 102 %
FVC-%Change-Post: -1 %
FVC-%Pred-Post: 84 %
FVC-%Pred-Pre: 86 %
FVC-Post: 5.06 L
FVC-Pre: 5.16 L
Post FEV1/FVC ratio: 75 %
Post FEV6/FVC ratio: 100 %
Pre FEV1/FVC ratio: 72 %
Pre FEV6/FVC Ratio: 99 %

## 2022-02-01 MED ORDER — AMOXICILLIN-POT CLAVULANATE 875-125 MG PO TABS: 1.0000 | ORAL_TABLET | Freq: Two times a day (BID) | ORAL | 0 refills | Status: DC

## 2022-02-01 MED ORDER — PREDNISONE 10 MG PO TABS: ORAL_TABLET | ORAL | 0 refills | Status: DC

## 2022-02-01 NOTE — Progress Notes (Signed)
Pre/Post Spirometry Performed Today. 

## 2022-02-01 NOTE — Telephone Encounter (Signed)
What is least expensive laba/ics for him? 

## 2022-02-01 NOTE — Patient Instructions (Signed)
Pre/Post Spirometry Performed Today. 

## 2022-02-01 NOTE — Telephone Encounter (Signed)
Test claims for ICS+LABA return the following:   Advair Diskus PA Breo $70.00 Dulera $68.27 Symbicort $70.00

## 2022-02-01 NOTE — Patient Instructions (Addendum)
-   prednisone taper, augmentin  - I'll send message to our pharmacists to verify least expensive LABA/ICS inhaler for you and then will send you my chart message and prescription once I hear back - would continue to use neti pot daily with distilled water and salt/bicarb packets from neilmed, gently clear nose of crusting and then flonase 1 spray each nostril daily till next visit - allegra and singulair daily - albuterol inhaler 1-2 puffs as needed or can use 5-20 minutes before heavy exertion or mowing to see if this helps - would continue anti reflux medicine 30 minutes before meal whether breakfast or dinner - if no better next visit we can consider referral to ENT, biologic for eosinophilic rhinitis and/or asthma, or referral again to allergy

## 2022-02-06 ENCOUNTER — Other Ambulatory Visit: Payer: Self-pay | Admitting: Physician Assistant

## 2022-03-04 ENCOUNTER — Other Ambulatory Visit: Payer: Self-pay | Admitting: Family Medicine

## 2022-03-04 DIAGNOSIS — M109 Gout, unspecified: Secondary | ICD-10-CM

## 2022-04-03 ENCOUNTER — Other Ambulatory Visit: Payer: Self-pay | Admitting: Family Medicine

## 2022-04-03 DIAGNOSIS — J40 Bronchitis, not specified as acute or chronic: Secondary | ICD-10-CM

## 2022-08-16 ENCOUNTER — Ambulatory Visit: Payer: Managed Care, Other (non HMO) | Admitting: Sports Medicine

## 2022-08-16 ENCOUNTER — Ambulatory Visit: Payer: Managed Care, Other (non HMO)

## 2022-08-16 DIAGNOSIS — M722 Plantar fascial fibromatosis: Secondary | ICD-10-CM

## 2022-08-16 MED ORDER — MELOXICAM 15 MG PO TABS: ORAL_TABLET | ORAL | 3 refills | Status: DC

## 2022-08-16 NOTE — Progress Notes (Signed)
    Procedures performed today:    None.  Independent interpretation of notes and tests performed by another provider:   None.  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 Unfortunately for the past several months he has had increasing pain plantar heel left-sided worse in the morning, on exam he does have pes cavus, tenderness at the plantar fascial origin. He would like to start conservatively and I agree, we will do x-rays, intrinsic conditioning, meloxicam. Avoid barefoot walking and return to see me in about 6 weeks, injection if not better.    ____________________________________________ William Marshall. Benjamin Stain, M.D., ABFM., CAQSM., AME. Primary Care and Sports Medicine [facility]  Adjunct Professor of Family Medicine  [employer] [employer]  Restaurant manager, fast food

## 2022-08-16 NOTE — Assessment & Plan Note (Signed)
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 Unfortunately for the past several months he has had increasing pain plantar heel left-sided worse in the morning, on exam he does have pes cavus, tenderness at the plantar fascial origin. He would like to start conservatively and I agree, we will do x-rays, intrinsic conditioning, meloxicam. Avoid barefoot walking and return to see me in about 6 weeks, injection if not better.

## 2022-09-27 ENCOUNTER — Ambulatory Visit (INDEPENDENT_AMBULATORY_CARE_PROVIDER_SITE_OTHER): Payer: Managed Care, Other (non HMO) | Admitting: Sports Medicine

## 2022-09-27 ENCOUNTER — Ambulatory Visit: Payer: Managed Care, Other (non HMO) | Admitting: Sports Medicine

## 2022-09-27 DIAGNOSIS — M722 Plantar fascial fibromatosis: Secondary | ICD-10-CM

## 2022-09-27 NOTE — Progress Notes (Signed)
    Procedures performed today:    None.  Independent interpretation of notes and tests performed by another provider:   None.  Brief History, Exam, Impression, and Recommendations:    Plantar fasciitis, left Pleasant firefighter, plantar fasciitis, has done well with inserts, rehab, NSAIDS.  Had a flare in pain after jumping off a ladder truck a couple weeks ago but this is resolving.  Can come back to see me PRN.    ____________________________________________ William Marshall. William Marshall, M.D., ABFM., CAQSM., AME. Primary Care and Sports Medicine [facility]  Adjunct Professor of Family Medicine  [employer]  Restaurant manager, fast food

## 2022-09-27 NOTE — Assessment & Plan Note (Signed)
Pleasant firefighter, plantar fasciitis, has done well with inserts, rehab, NSAIDS.  Had a flare in pain after jumping off a ladder truck a couple weeks ago but this is resolving.  Can come back to see me PRN.

## 2022-11-15 ENCOUNTER — Ambulatory Visit (INDEPENDENT_AMBULATORY_CARE_PROVIDER_SITE_OTHER): Payer: Managed Care, Other (non HMO)

## 2022-11-15 ENCOUNTER — Ambulatory Visit (INDEPENDENT_AMBULATORY_CARE_PROVIDER_SITE_OTHER): Payer: Managed Care, Other (non HMO) | Admitting: Family Medicine

## 2022-11-15 ENCOUNTER — Encounter: Payer: Self-pay | Admitting: Family Medicine

## 2022-11-15 VITALS — BP 163/98 | HR 68 | Ht 75.0 in | Wt 287.0 lb

## 2022-11-15 DIAGNOSIS — N200 Calculus of kidney: Secondary | ICD-10-CM

## 2022-11-15 DIAGNOSIS — R109 Unspecified abdominal pain: Secondary | ICD-10-CM

## 2022-11-15 DIAGNOSIS — M109 Gout, unspecified: Secondary | ICD-10-CM

## 2022-11-15 LAB — POCT URINALYSIS DIP (CLINITEK)
Bilirubin, UA: NEGATIVE
Blood, UA: NEGATIVE
Glucose, UA: NEGATIVE mg/dL
Ketones, POC UA: NEGATIVE mg/dL
Leukocytes, UA: NEGATIVE
Nitrite, UA: NEGATIVE
POC PROTEIN,UA: NEGATIVE
Spec Grav, UA: 1.015 (ref 1.010–1.025)
Urobilinogen, UA: 0.2 U/dL
pH, UA: 8 (ref 5.0–8.0)

## 2022-11-15 MED ORDER — COLCHICINE 0.6 MG PO CAPS: ORAL_CAPSULE | ORAL | 2 refills | Status: AC

## 2022-11-15 MED ORDER — TAMSULOSIN HCL 0.4 MG PO CAPS: 0.4000 mg | ORAL_CAPSULE | Freq: Every day | ORAL | 3 refills | Status: DC

## 2022-11-15 NOTE — Progress Notes (Unsigned)
William Marshall - 47 y.o. male MRN [id number]  Date of birth: Apr 10, 1975  Subjective Chief Complaint  Patient presents with  . Flank Pain    HPI William Marshall is a 47 y.o. male here today with complaint of R flank pain.  He reports that this feels similar to stone he has had in the past.  Pain comes and goes in waves.  Describes as sharp pain that radiates around the back and flank area.  Denies urinary urgency, frequency or hematuria.    No Known Allergies  Past Medical History:  Diagnosis Date  . GERD (gastroesophageal reflux disease)   . Gout   . History of osteomyelitis as a child     No past surgical history on file.  Social History   Socioeconomic History  . Marital status: Married    Spouse name: Not on file  . Number of children: Not on file  . Years of education: Not on file  . Highest education level: Not on file  Occupational History  . Not on file  Tobacco Use  . Smoking status: Never  . Smokeless tobacco: Never  Vaping Use  . Vaping status: Never Used  Substance and Sexual Activity  . Alcohol use: Not Currently    Alcohol/week: 20.0 standard drinks of alcohol    Types: 20 Cans of beer per week  . Drug use: No  . Sexual activity: Not on file  Other Topics Concern  . Not on file  Social History Narrative  . Not on file   Social Determinants of Health   Financial Resource Strain: Low Risk  (04/11/2022)   Received from [facility], [facility]   Overall Financial Resource Strain (CARDIA)   . Difficulty of Paying Living Expenses: Not very hard  Food Insecurity: No Food Insecurity (04/11/2022)   Received from [facility], [facility]   Hunger Vital Sign   . Worried About Programme researcher, broadcasting/film/video in the Last Year: Never true   . Ran Out of Food in the Last Year: Never true  Transportation Needs: No Transportation Needs (04/11/2022)   Received from [facility], [facility]   [facility] - Transportation   . Lack of Transportation (Medical): No   . Lack of  Transportation (Non-Medical): No  Physical Activity: Insufficiently Active (04/11/2022)   Received from [facility], [facility]   Exercise Vital Sign   . Days of Exercise per Week: 3 days   . Minutes of Exercise per Session: 30 min  Stress: No Stress Concern Present (04/11/2022)   Received from [facility], [facility] of Occupational Health - Occupational Stress Questionnaire   . Feeling of Stress : Only a little  Social Connections: Moderately Integrated (04/11/2022)   Received from [facility], [facility]   Social Network   . How would you rate your social network (family, work, friends)?: Adequate participation with social networks    Family History  Problem Relation Age of Onset  . Hypertension Mother   . Diabetes Mother   . Kidney disease Maternal Grandmother     Health Maintenance  Topic Date Due  . COVID-19 Vaccine (3 - 2023-24 season) 03/02/2023 (Originally 11/10/2022)  . INFLUENZA VACCINE  06/09/2023 (Originally 10/10/2022)  . DTaP/Tdap/Td (2 - Td or Tdap) 03/18/2027  . Colonoscopy  05/20/2030  . Hepatitis C Screening  Completed  . HIV Screening  Completed  . HPV VACCINES  Aged Out     ----------------------------------------------------------------------------------------------------------------------------------------------------------------------------------------------------------------- Physical Exam BP (!) 168/107 (BP  Location: Right Arm, Patient Position: Sitting, Cuff Size: Large)   Pulse 68   Ht 6\' 3"  (1.905 m)   Wt 287 lb (130.2 kg)   SpO2 98%   BMI 35.87 kg/m   Physical Exam  ------------------------------------------------------------------------------------------------------------------------------------------------------------------------------------------------------------------- Assessment and Plan  No problem-specific Assessment & Plan notes found for this encounter.   Meds ordered this encounter  Medications  .  Colchicine (MITIGARE) 0.6 MG CAPS    Sig: TAKE 1 TABLET (0.6 MG TOTAL) BY MOUTH 2 (TWO) TIMES DAILY.    Dispense:  60 capsule    Refill:  2  . tamsulosin (FLOMAX) 0.4 MG CAPS capsule    Sig: Take 1 capsule (0.4 mg total) by mouth daily.    Dispense:  30 capsule    Refill:  3    No follow-ups on file.    This visit occurred during the SARS-CoV-2 public health emergency.  Safety protocols were in place, including screening questions prior to the visit, additional usage of staff PPE, and extensive cleaning of exam room while observing appropriate contact time as indicated for disinfecting solutions.

## 2022-11-18 ENCOUNTER — Encounter: Payer: Self-pay | Admitting: Family Medicine

## 2022-11-18 DIAGNOSIS — R109 Unspecified abdominal pain: Secondary | ICD-10-CM | POA: Insufficient documentation

## 2022-11-18 NOTE — Assessment & Plan Note (Signed)
KUB ordered.  Will go ahead and add Flomax.  Recommend increase fluid intake.

## 2022-11-18 NOTE — Assessment & Plan Note (Signed)
Recent gout flare.  Responded well to colchicine previously.  Needs renewal which I sent in today.

## 2022-11-29 ENCOUNTER — Ambulatory Visit (INDEPENDENT_AMBULATORY_CARE_PROVIDER_SITE_OTHER): Payer: Managed Care, Other (non HMO) | Admitting: Family Medicine

## 2022-11-29 ENCOUNTER — Other Ambulatory Visit: Payer: Self-pay | Admitting: Family Medicine

## 2022-11-29 VITALS — BP 145/86 | HR 66 | Ht 75.0 in | Wt 287.0 lb

## 2022-11-29 DIAGNOSIS — I10 Essential (primary) hypertension: Secondary | ICD-10-CM

## 2022-11-29 DIAGNOSIS — Z23 Encounter for immunization: Secondary | ICD-10-CM

## 2022-11-29 MED ORDER — VALSARTAN 80 MG PO TABS: 80.0000 mg | ORAL_TABLET | Freq: Every day | ORAL | 1 refills | Status: DC

## 2022-11-29 NOTE — Progress Notes (Signed)
Pt presents to clinic today for BP check.  Denies any cp/sob/palpitaions/headaches/dizziness or swelling.   He is not currently on any BP medications.   His initial BP reading was elevated I had him to wait.  2nd bp was still elevated. Advised pcp he would like him to start Valsartan 80 mg every day and RTC in 2 weeks for BP check and have a BMP checked also.

## 2022-12-12 ENCOUNTER — Ambulatory Visit (INDEPENDENT_AMBULATORY_CARE_PROVIDER_SITE_OTHER): Payer: Managed Care, Other (non HMO)

## 2022-12-12 VITALS — BP 138/88 | HR 73 | Ht 75.0 in

## 2022-12-12 DIAGNOSIS — I10 Essential (primary) hypertension: Secondary | ICD-10-CM | POA: Diagnosis not present

## 2022-12-12 NOTE — Patient Instructions (Signed)
Continue current medication regimen and schedule HTN  follow up in 4 months with Dr. Ashley Royalty.

## 2022-12-12 NOTE — Progress Notes (Signed)
   Established Patient Office Visit  Subjective   Patient ID: William Marshall, male    DOB: 1975/08/12  Age: 48 y.o. MRN: [id number]  Chief Complaint  Patient presents with   Hypertension    BP check nurse visit.     HPI  Hypertension- BP check nurse visit.  Patient denies chest pain, shortness of breath, dizziness, palpitations or medication problems. Patient mentioned that he felt that the new medication ( Valsartan ) was causing his BP readings to be higher than previous.  ROS    Objective:     BP 138/88   Pulse 73   Ht 6\' 3"  (1.905 m)   SpO2 99%   BMI 35.87 kg/m    Physical Exam   No results found for any visits on 12/12/22.    The 10-year ASCVD risk score (Arnett DK, et al., 2019) is: 3.2%* (Cholesterol units were assumed)    Assessment & Plan:  BP check nurse visit- initial reading = 153/88. Second reading = 138/88. Per Dr. Ashley Royalty continue current medication regimen and return in 4 months for HTN follow up with him.  Problem List Items Addressed This Visit       Cardiovascular and Mediastinum   Essential hypertension - Primary    Return in about 4 months (around 04/14/2023) for HTN follow up with Dr. Ashley Royalty. Elizabeth Palau, LPN

## 2022-12-13 LAB — BASIC METABOLIC PANEL
BUN/Creatinine Ratio: 12 (ref 9–20)
BUN: 12 mg/dL (ref 6–24)
CO2: 22 mmol/L (ref 20–29)
Calcium: 9.6 mg/dL (ref 8.7–10.2)
Chloride: 102 mmol/L (ref 96–106)
Creatinine, Ser: 0.98 mg/dL (ref 0.76–1.27)
Glucose: 102 mg/dL — ABNORMAL HIGH (ref 70–99)
Potassium: 4.4 mmol/L (ref 3.5–5.2)
Sodium: 140 mmol/L (ref 134–144)
eGFR: 95 mL/min/{1.73_m2} (ref >59)

## 2023-02-09 ENCOUNTER — Other Ambulatory Visit: Payer: Self-pay | Admitting: Family Medicine

## 2023-02-23 ENCOUNTER — Other Ambulatory Visit: Payer: Self-pay | Admitting: Family Medicine

## 2023-02-23 DIAGNOSIS — M109 Gout, unspecified: Secondary | ICD-10-CM

## 2023-04-14 ENCOUNTER — Encounter: Payer: Self-pay | Admitting: Family Medicine

## 2023-04-14 ENCOUNTER — Ambulatory Visit: Payer: Managed Care, Other (non HMO) | Admitting: Family Medicine

## 2023-04-14 VITALS — BP 143/93 | HR 72 | Ht 75.0 in | Wt 296.0 lb

## 2023-04-14 DIAGNOSIS — I10 Essential (primary) hypertension: Secondary | ICD-10-CM

## 2023-04-14 DIAGNOSIS — M722 Plantar fascial fibromatosis: Secondary | ICD-10-CM | POA: Diagnosis not present

## 2023-04-14 MED ORDER — VALSARTAN-HYDROCHLOROTHIAZIDE 160-12.5 MG PO TABS: 1.0000 | ORAL_TABLET | Freq: Every day | ORAL | 3 refills | Status: DC

## 2023-04-14 NOTE — Progress Notes (Signed)
William Marshall - 48 y.o. male MRN [id number]  Date of birth: March 18, 1975  Subjective Chief Complaint  Patient presents with   Hypertension    Patient states he has home wrist cuff but does not feel that this is accurate    HPI William Marshall is a 48 y.o. male here today for follow up visit.  He reports that he is feeling pretty well.  His BP is elevated today.  He reports that he is taking medication as directed.  He denies symptoms related to HTN including chest pain, shortness of breath, palpitations, headache or vision changes.   He has has recurrent issues with foot pain related to plantar fasciitis.  This has hindered his ability to exercise somewhat.  He did try inserts and has seen Dr. Benjamin Stain for this previously.  ROS:  A comprehensive ROS was completed and negative except as noted per HPI  No Known Allergies  Past Medical History:  Diagnosis Date   GERD (gastroesophageal reflux disease)    Gout    History of osteomyelitis as a child     History reviewed. No pertinent surgical history.  Social History   Socioeconomic History   Marital status: Married    Spouse name: Not on file   Number of children: Not on file   Years of education: Not on file   Highest education level: Not on file  Occupational History   Not on file  Tobacco Use   Smoking status: Never   Smokeless tobacco: Never  Vaping Use   Vaping status: Never Used  Substance and Sexual Activity   Alcohol use: Not Currently    Alcohol/week: 20.0 standard drinks of alcohol    Types: 20 Cans of beer per week   Drug use: No   Sexual activity: Not on file  Other Topics Concern   Not on file  Social History Narrative   Not on file   Social Drivers of Health   Financial Resource Strain: Low Risk  (04/11/2022)   Received from [facility], [facility]   Overall Financial Resource Strain (CARDIA)    Difficulty of Paying Living Expenses: Not very hard  Food Insecurity: No Food Insecurity (04/11/2022)    Received from [facility], [facility]   Hunger Vital Sign    Worried About Running Out of Food in the Last Year: Never true    Ran Out of Food in the Last Year: Never true  Transportation Needs: No Transportation Needs (04/11/2022)   Received from [facility], [facility]   PRAPARE - Transportation    Lack of Transportation (Medical): No    Lack of Transportation (Non-Medical): No  Physical Activity: Insufficiently Active (04/11/2022)   Received from [facility], [facility]   Exercise Vital Sign    Days of Exercise per Week: 3 days    Minutes of Exercise per Session: 30 min  Stress: No Stress Concern Present (04/11/2022)   Received from [facility], [facility] of Occupational Health - Occupational Stress Questionnaire    Feeling of Stress : Only a little  Social Connections: Moderately Integrated (04/11/2022)   Received from [facility], [facility]   Social Network    How would you rate your social network (family, work, friends)?: Adequate participation with social networks    Family History  Problem Relation Age of Onset   Hypertension Mother    Diabetes Mother    Kidney disease Maternal Grandmother     Health Maintenance  Topic Date  Due   COVID-19 Vaccine (3 - 2024-25 season) 04/30/2023 (Originally 11/10/2022)   Zoster Vaccines- Shingrix (1 of 2) 07/12/2023 (Originally 03/14/2023)   DTaP/Tdap/Td (2 - Td or Tdap) 03/18/2027   Colonoscopy  05/20/2030   INFLUENZA VACCINE  Completed   Hepatitis C Screening  Completed   HIV Screening  Completed   HPV VACCINES  Aged Out     ----------------------------------------------------------------------------------------------------------------------------------------------------------------------------------------------------------------- Physical Exam BP (!) 143/93 (Cuff Size: Large)   Pulse 72   Ht 6\' 3"  (1.905 m)   Wt 296 lb (134.3 kg)   SpO2 100%   BMI 37.00 kg/m   Physical  Exam Constitutional:      Appearance: Normal appearance.  Eyes:     General: No scleral icterus. Cardiovascular:     Rate and Rhythm: Normal rate and regular rhythm.  Pulmonary:     Effort: Pulmonary effort is normal.     Breath sounds: Normal breath sounds.  Musculoskeletal:     Cervical back: Neck supple.  Neurological:     Mental Status: He is alert.  Psychiatric:        Mood and Affect: Mood normal.        Behavior: Behavior normal.     ------------------------------------------------------------------------------------------------------------------------------------------------------------------------------------------------------------------- Assessment and Plan  Essential hypertension Blood pressure is elevated.  Changing valsartan to valsartan with hydrochlorothiazide at 160/12.5 mg.  Follow-up in 2 weeks for blood pressure recheck.  Plantar fasciitis, left He continues to have issues with plantar fasciitis.  Encouraged follow-up with sports med.  Additionally he can use gel heel cups and additional arch support   Meds ordered this encounter  Medications   valsartan-hydrochlorothiazide (DIOVAN-HCT) 160-12.5 MG tablet    Sig: Take 1 tablet by mouth daily.    Dispense:  90 tablet    Refill:  3    Return in about 2 weeks (around 04/28/2023) for nurse visit for BP check.    This visit occurred during the SARS-CoV-2 public health emergency.  Safety protocols were in place, including screening questions prior to the visit, additional usage of staff PPE, and extensive cleaning of exam room while observing appropriate contact time as indicated for disinfecting solutions.

## 2023-04-14 NOTE — Assessment & Plan Note (Signed)
Blood pressure is elevated.  Changing valsartan to valsartan with hydrochlorothiazide at 160/12.5 mg.  Follow-up in 2 weeks for blood pressure recheck.

## 2023-04-14 NOTE — Assessment & Plan Note (Signed)
He continues to have issues with plantar fasciitis.  Encouraged follow-up with sports med.  Additionally he can use gel heel cups and additional arch support

## 2023-04-14 NOTE — Patient Instructions (Signed)
Start valsartan/hydrochlorothiazide 160/12.5mg .  This will replace valsartan.   Check with your insurance to see if they cover Wegovy or Zepbound for weight loss.

## 2023-04-29 ENCOUNTER — Ambulatory Visit: Payer: Managed Care, Other (non HMO)

## 2023-04-30 ENCOUNTER — Ambulatory Visit (INDEPENDENT_AMBULATORY_CARE_PROVIDER_SITE_OTHER): Payer: Managed Care, Other (non HMO) | Admitting: Family Medicine

## 2023-04-30 VITALS — BP 145/87 | HR 75 | Temp 98.3°F | Ht 75.0 in | Wt 293.0 lb

## 2023-04-30 DIAGNOSIS — I10 Essential (primary) hypertension: Secondary | ICD-10-CM | POA: Diagnosis not present

## 2023-04-30 NOTE — Progress Notes (Signed)
 Medical screening examination/treatment was performed by qualified clinical staff member and as supervising physician I was immediately available for consultation/collaboration. I have reviewed documentation and agree with assessment and plan.  Everrett Coombe, DO

## 2023-04-30 NOTE — Progress Notes (Signed)
 Patient is here for blood pressure check. Denies trouble sleeping, palpitations, dizziness, lightheadedness, blurry vision, chest pain, shortness of breath, headaches and/or medication problems.   Patient mentioned he is taking amoxicillin for 10 days for a sinus infection. Per patient he is due to complete the antibiotic course tomorrow 05/01/23.  Patient's blood pressures were out of goal range: #1 144/86, pulse 78 #2 140/92, pulse 75 #3 145/87, pulse 75  Provider notified of current blood pressure reading. Per provider, patient is to take two tabs of valsartan/hydrochlorothiazide daily. Daily dose increased from 160/12.5 mg to 320/15 mg.  Patient informed to schedule next NV appointment in 2 weeks.

## 2023-05-15 ENCOUNTER — Ambulatory Visit (INDEPENDENT_AMBULATORY_CARE_PROVIDER_SITE_OTHER): Payer: Managed Care, Other (non HMO) | Admitting: Family Medicine

## 2023-05-15 ENCOUNTER — Encounter: Payer: Self-pay | Admitting: Family Medicine

## 2023-05-15 VITALS — BP 125/87 | HR 79

## 2023-05-15 DIAGNOSIS — I10 Essential (primary) hypertension: Secondary | ICD-10-CM

## 2023-05-15 NOTE — Progress Notes (Signed)
 Medical screening examination/treatment was performed by qualified clinical staff member and as supervising physician I was immediately available for consultation/collaboration. I have reviewed documentation and agree with assessment and plan.  Everrett Coombe, DO

## 2023-05-15 NOTE — Progress Notes (Signed)
 HPI  Pt here today for BP recheck. Last OV BP was 145/87.                           Assessment and Plan:   Pt BP is stable today at 125/87. Pt is to RTC as needed.

## 2023-05-24 ENCOUNTER — Other Ambulatory Visit: Payer: Self-pay | Admitting: Family Medicine

## 2023-06-05 ENCOUNTER — Other Ambulatory Visit: Payer: Self-pay | Admitting: Family Medicine

## 2023-06-05 ENCOUNTER — Encounter: Payer: Self-pay | Admitting: Family Medicine

## 2023-06-05 MED ORDER — VALSARTAN-HYDROCHLOROTHIAZIDE 320-25 MG PO TABS: 1.0000 | ORAL_TABLET | Freq: Every day | ORAL | 3 refills | Status: AC

## 2023-06-05 NOTE — Telephone Encounter (Signed)
 Copied from CRM [id number]. Topic: Clinical - Prescription Issue >> Jun 05, 2023  7:59 AM Izetta Dakin wrote: Reason for CRM: valsartan-hydrochlorothiazide (DIOVAN-HCT) 160-12.5 MG tablet. Pharmacy will not allow refill because patient is taking medication twice daily instead of once daily. Patient is requesting a new prescription be sent to his preferred pharmacy.

## 2023-07-09 LAB — LAB REPORT - SCANNED
EGFR (Non-African Amer.): 78
PSA, Total: 0.6
TSH: 2.81 (ref 0.41–5.90)

## 2023-09-03 ENCOUNTER — Ambulatory Visit: Admitting: Family Medicine

## 2023-09-03 ENCOUNTER — Other Ambulatory Visit: Payer: Self-pay | Admitting: Family Medicine

## 2023-09-03 VITALS — BP 109/73 | HR 94 | Ht 75.0 in | Wt 299.0 lb

## 2023-09-03 DIAGNOSIS — R7401 Elevation of levels of liver transaminase levels: Secondary | ICD-10-CM

## 2023-09-03 DIAGNOSIS — I10 Essential (primary) hypertension: Secondary | ICD-10-CM | POA: Diagnosis not present

## 2023-09-03 DIAGNOSIS — R7989 Other specified abnormal findings of blood chemistry: Secondary | ICD-10-CM | POA: Diagnosis not present

## 2023-09-03 DIAGNOSIS — R7309 Other abnormal glucose: Secondary | ICD-10-CM | POA: Diagnosis not present

## 2023-09-03 DIAGNOSIS — R4589 Other symptoms and signs involving emotional state: Secondary | ICD-10-CM

## 2023-09-03 MED ORDER — ATOMOXETINE HCL 40 MG PO CAPS: ORAL_CAPSULE | ORAL | 1 refills | Status: DC

## 2023-09-03 MED ORDER — MELOXICAM 15 MG PO TABS: 15.0000 mg | ORAL_TABLET | Freq: Every day | ORAL | 0 refills | Status: AC

## 2023-09-03 NOTE — Progress Notes (Signed)
 [address] - 48 y.o. male MRN [id number]  Date of birth: 05-Jul-1975  Subjective Chief Complaint  Patient presents with   Hypertension    HPI William Marshall is a 47 y.o. male here today for follow up visit.  Reports that he is doing okay.  Has been dealing with some issues with depression and anxiety.  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  He has met with therapist for a few sessions.  She is concerned about possible untreated ADHD and recommended discussing trial of Strattera .  Continues on valsartan /hydrochlorothiazide  for management of HTN.  BP is well controlled a this time.  Denies chest pain, shortness of breath, palpitations, headaches or visual changes.  ROS:  A comprehensive ROS was completed and negative except as noted per HPI  No Known Allergies  Past Medical History:  Diagnosis Date   GERD (gastroesophageal reflux disease)    Gout    History of osteomyelitis as a child     No past surgical history on file.  Social History   Socioeconomic History   Marital status: Married    Spouse name: Not on file   Number of children: Not on file   Years of education: Not on file   Highest education level: Not on file  Occupational History   Not on file  Tobacco Use   Smoking status: Never   Smokeless tobacco: Never  Vaping Use   Vaping status: Never Used  Substance and Sexual Activity   Alcohol use: Not Currently    Alcohol/week: 20.0 standard drinks of alcohol    Types: 20 Cans of beer per week   Drug use: No   Sexual activity: Not on file  Other Topics Concern   Not on file  Social History Narrative   Not on file   Social Drivers of Health   Financial Resource Strain: Low Risk  (09/03/2023)   Overall Financial Resource Strain (CARDIA)    Difficulty of Paying Living Expenses: Not hard at all  Food Insecurity: No Food Insecurity (09/03/2023)   Hunger Vital Sign    Worried About  Running Out of Food in the Last Year: Never true    Ran Out of Food in the Last Year: Never true  Transportation Needs: No Transportation Needs (09/03/2023)   PRAPARE - Administrator, Civil Service (Medical): No    Lack of Transportation (Non-Medical): No  Physical Activity: Insufficiently Active (09/03/2023)   Exercise Vital Sign    Days of Exercise per Week: 3 days    Minutes of Exercise per Session: 30 min  Stress: Stress Concern Present (09/03/2023)   [employer] of Occupational Health - Occupational Stress Questionnaire    Feeling of Stress: Very much  Social Connections: Moderately Integrated (09/03/2023)   Social Connection and Isolation Panel    Frequency of Communication with Friends and Family: Never    Frequency of Social Gatherings with Friends and Family: Never    Attends Religious Services: More than 4 times per year    Active Member of [employer] or Organizations: Yes    Attends Banker Meetings: 1 to 4 times per year    Marital Status: Married    Family History  Problem Relation Age of Onset   Hypertension Mother    Diabetes Mother    Kidney disease Maternal Grandmother     Health Maintenance  Topic Date Due   Hepatitis B  Vaccines (1 of 3 - 19+ 3-dose series) Never done   COVID-19 Vaccine (3 - 2024-25 season) 11/10/2022   Zoster Vaccines- Shingrix (1 of 2) Never done   INFLUENZA VACCINE  10/10/2023   DTaP/Tdap/Td (2 - Td or Tdap) 03/18/2027   Colonoscopy  05/20/2030   Hepatitis C Screening  Completed   HIV Screening  Completed   HPV VACCINES  Aged Out   Meningococcal B Vaccine  Aged Out     ----------------------------------------------------------------------------------------------------------------------------------------------------------------------------------------------------------------- Physical Exam BP 109/73 (BP Location: Left Arm, Patient Position: Sitting, Cuff Size: Large)   Pulse 94   Ht 6' 3 (1.905 m)   Wt 299  lb (135.6 kg)   SpO2 97%   BMI 37.37 kg/m   Physical Exam Constitutional:      Appearance: Normal appearance.   Eyes:     General: No scleral icterus.   Cardiovascular:     Rate and Rhythm: Normal rate and regular rhythm.  Pulmonary:     Effort: Pulmonary effort is normal.     Breath sounds: Normal breath sounds.   Musculoskeletal:     Cervical back: Neck supple.   Neurological:     Mental Status: He is alert.   Psychiatric:        Mood and Affect: Mood normal.        Behavior: Behavior normal.     ------------------------------------------------------------------------------------------------------------------------------------------------------------------------------------------------------------------- Assessment and Plan  Essential hypertension Blood pressure is well-controlled at this time.  Recommend continuation of current medication for management of hypertension.  Transaminitis Update hepatic function.  Dysphoric mood He has had some increased depression and anxiety.  He is seeing a therapist who has concerns about ADHD symptoms.  Concerns about possible ADHD.  We did discuss a potential trial of Strattera  including potential side effects.  Trial and follow-up in about 6 weeks.   Meds ordered this encounter  Medications   meloxicam  (MOBIC ) 15 MG tablet    Sig: Take 1 tablet (15 mg total) by mouth daily.    Dispense:  30 tablet    Refill:  0   atomoxetine  (STRATTERA ) 40 MG capsule    Sig: Start 40mg  daily x7 days then increase to 40mg  BID.    Dispense:  60 capsule    Refill:  1    Return in about 6 weeks (around 10/15/2023) for Mood/BH, Hypertension.

## 2023-09-04 LAB — HEMOGLOBIN A1C
Est. average glucose Bld gHb Est-mCnc: 120 mg/dL
Hgb A1c MFr Bld: 5.8 % — ABNORMAL HIGH (ref 4.8–5.6)

## 2023-09-04 LAB — HEPATIC FUNCTION PANEL
ALT: 231 IU/L — ABNORMAL HIGH (ref 0–44)
AST: 97 IU/L — ABNORMAL HIGH (ref 0–40)
Albumin: 4.4 g/dL (ref 4.1–5.1)
Alkaline Phosphatase: 85 IU/L (ref 44–121)
Bilirubin Total: 0.8 mg/dL (ref 0.0–1.2)
Bilirubin, Direct: 0.31 mg/dL (ref 0.00–0.40)
Total Protein: 6.9 g/dL (ref 6.0–8.5)

## 2023-09-07 ENCOUNTER — Encounter: Payer: Self-pay | Admitting: Family Medicine

## 2023-09-07 DIAGNOSIS — R4589 Other symptoms and signs involving emotional state: Secondary | ICD-10-CM | POA: Insufficient documentation

## 2023-09-07 NOTE — Assessment & Plan Note (Signed)
 Update hepatic function.

## 2023-09-07 NOTE — Assessment & Plan Note (Signed)
Blood pressure is well-controlled at this time.  Recommend continuation of current medication for management of hypertension.

## 2023-09-07 NOTE — Assessment & Plan Note (Signed)
 He has had some increased depression and anxiety.  He is seeing a therapist who has concerns about ADHD symptoms.  Concerns about possible ADHD.  We did discuss a potential trial of Strattera  including potential side effects.  Trial and follow-up in about 6 weeks.

## 2023-09-08 ENCOUNTER — Telehealth: Payer: Self-pay

## 2023-09-08 ENCOUNTER — Other Ambulatory Visit (HOSPITAL_COMMUNITY): Payer: Self-pay

## 2023-09-08 NOTE — Telephone Encounter (Signed)
 Pharmacy Patient Advocate Encounter   Received notification from Pt Calls Messages that prior authorization for Atomoxetine  40mg  caps is required/requested.   Insurance verification completed.   The patient is insured through [employer] .   Per test claim: PA required; PA submitted to above mentioned insurance via CoverMyMeds Key/confirmation #/EOC [id number] Status is pending

## 2023-09-09 ENCOUNTER — Telehealth: Payer: Self-pay

## 2023-09-09 NOTE — Telephone Encounter (Signed)
 Patient's wife vicki called Questioning about why the prescription for  Straterra ( atomoxetine  ) has not been approved by Dr. Alvia -states she was told by pharmacy that they were awaiting approval by Dr. Alvia.  Explained that the approval needed was for a prior authorization through her insurance company and that this had been submitted yesterday 09/08/2023 and could take up to 3 days to process.  She voiced understanding and will await the insurance results.

## 2023-09-14 ENCOUNTER — Ambulatory Visit: Payer: Self-pay | Admitting: Family Medicine

## 2023-09-14 DIAGNOSIS — R7401 Elevation of levels of liver transaminase levels: Secondary | ICD-10-CM

## 2023-09-15 ENCOUNTER — Other Ambulatory Visit (HOSPITAL_COMMUNITY): Payer: Self-pay

## 2023-09-15 NOTE — Telephone Encounter (Signed)
 Pharmacy Patient Advocate Encounter  Received notification from [facility] that Prior Authorization for Atomoxetine  40mg  caps has been APPROVED from 08/28/23 to 09/10/24. Ran test claim, Copay is $10. This test claim was processed through [facility]- copay amounts may vary at other pharmacies due to pharmacy/plan contracts, or as the patient moves through the different stages of their insurance plan.   PA #/Case ID/Reference #: [id number]  Left a message at [facility] to notify of the approval

## 2023-09-25 ENCOUNTER — Other Ambulatory Visit: Payer: Self-pay | Admitting: Family Medicine

## 2023-10-15 ENCOUNTER — Ambulatory Visit: Admitting: Family Medicine

## 2023-10-30 ENCOUNTER — Encounter: Payer: Self-pay | Admitting: Family Medicine

## 2023-10-30 ENCOUNTER — Ambulatory Visit: Admitting: Family Medicine

## 2023-10-30 ENCOUNTER — Telehealth: Payer: Self-pay | Admitting: Family Medicine

## 2023-10-30 VITALS — BP 128/81 | HR 75 | Ht 75.0 in | Wt 299.0 lb

## 2023-10-30 DIAGNOSIS — R3911 Hesitancy of micturition: Secondary | ICD-10-CM | POA: Diagnosis not present

## 2023-10-30 DIAGNOSIS — I10 Essential (primary) hypertension: Secondary | ICD-10-CM | POA: Diagnosis not present

## 2023-10-30 DIAGNOSIS — J0100 Acute maxillary sinusitis, unspecified: Secondary | ICD-10-CM | POA: Diagnosis not present

## 2023-10-30 DIAGNOSIS — N401 Enlarged prostate with lower urinary tract symptoms: Secondary | ICD-10-CM | POA: Diagnosis not present

## 2023-10-30 DIAGNOSIS — J019 Acute sinusitis, unspecified: Secondary | ICD-10-CM | POA: Insufficient documentation

## 2023-10-30 MED ORDER — MUPIROCIN 2 % EX OINT: TOPICAL_OINTMENT | CUTANEOUS | 0 refills | Status: AC

## 2023-10-30 MED ORDER — DOXYCYCLINE HYCLATE 100 MG PO TABS
100.0000 mg | ORAL_TABLET | Freq: Two times a day (BID) | ORAL | 0 refills | Status: AC
Start: 2023-10-30 — End: 2023-11-09

## 2023-10-30 NOTE — Telephone Encounter (Signed)
 Copied from CRM #[id number]. Topic: Referral - Status >> Oct 30, 2023 11:40 AM Graeme ORN wrote: Reason for CRM: Patient called. Just saw Dr Alvia. Told him to reach out to liver specialist. Number provider is non working number. Checked referral and google. Tried the numbers unable to find correct number for [facility] [phone] and [phone] do not work. Thank You

## 2023-10-30 NOTE — Assessment & Plan Note (Signed)
Blood pressure is well-controlled at this time.  Recommend continuation of current medication for management of hypertension.

## 2023-10-30 NOTE — Assessment & Plan Note (Signed)
 Updated PSA.  Check urinalysis.

## 2023-10-30 NOTE — Assessment & Plan Note (Signed)
 Treating with course of doxycycline .  Additionally, will add mupirocin  for pustule inside of nose.

## 2023-10-30 NOTE — Progress Notes (Signed)
 William Marshall - 48 y.o. male MRN [id number]  Date of birth: 1975/04/05  Subjective Chief Complaint  Patient presents with   Hypertension    HPI William Marshall is a 48 y.o. male here today for follow up visit.   He reports that he has a sore area on the inside of his nose along with sinus congestion and pressure on the R side.  Denies fever, chills, or lower respiratory tract symptoms.   BP is well controlled.  Doing well with valsartan /hydrochlorothiazide  at current strength.  He denies chest pain, shortness of breath, palpitations, headache or vision changes  He has had some intermittent episodes of diarrhea and diffficulty with voiding at times.  Sometimes he feels that he needs to have a bowel movement when he voids.  He did recently resatrt flomax  and this does seem to be helping some.    ROS:  A comprehensive ROS was completed and negative except as noted per HPI  No Known Allergies  Past Medical History:  Diagnosis Date   GERD (gastroesophageal reflux disease)    Gout    History of osteomyelitis as a child     History reviewed. No pertinent surgical history.  Social History   Socioeconomic History   Marital status: Married    Spouse name: Not on file   Number of children: Not on file   Years of education: Not on file   Highest education level: Not on file  Occupational History   Not on file  Tobacco Use   Smoking status: Never   Smokeless tobacco: Never  Vaping Use   Vaping status: Never Used  Substance and Sexual Activity   Alcohol use: Not Currently    Alcohol/week: 20.0 standard drinks of alcohol    Types: 20 Cans of beer per week   Drug use: No   Sexual activity: Not on file  Other Topics Concern   Not on file  Social History Narrative   Not on file   Social Drivers of Health   Financial Resource Strain: Low Risk  (09/03/2023)   Overall Financial Resource Strain (CARDIA)    Difficulty of Paying Living Expenses: Not hard at all  Food Insecurity: No  Food Insecurity (09/03/2023)   Hunger Vital Sign    Worried About Running Out of Food in the Last Year: Never true    Ran Out of Food in the Last Year: Never true  Transportation Needs: No Transportation Needs (09/03/2023)   PRAPARE - Administrator, Civil Service (Medical): No    Lack of Transportation (Non-Medical): No  Physical Activity: Insufficiently Active (09/03/2023)   Exercise Vital Sign    Days of Exercise per Week: 3 days    Minutes of Exercise per Session: 30 min  Stress: Stress Concern Present (09/03/2023)   [employer] of Occupational Health - Occupational Stress Questionnaire    Feeling of Stress: Very much  Social Connections: Moderately Integrated (09/03/2023)   Social Connection and Isolation Panel    Frequency of Communication with Friends and Family: Never    Frequency of Social Gatherings with Friends and Family: Never    Attends Religious Services: More than 4 times per year    Active Member of [employer] or Organizations: Yes    Attends Banker Meetings: 1 to 4 times per year    Marital Status: Married    Family History  Problem Relation Age of Onset   Hypertension Mother    Diabetes Mother    Kidney disease  Maternal Grandmother     Health Maintenance  Topic Date Due   Hepatitis B Vaccines 19-59 Average Risk (1 of 3 - 19+ 3-dose series) Never done   COVID-19 Vaccine (3 - 2024-25 season) 11/10/2022   Zoster Vaccines- Shingrix (1 of 2) 01/30/2024 (Originally 03/14/2023)   INFLUENZA VACCINE  06/08/2024 (Originally 10/10/2023)   Pneumococcal Vaccine: 50+ Years (1 of 1 - PCV) 10/29/2024 (Originally 03/14/2023)   DTaP/Tdap/Td (2 - Td or Tdap) 03/18/2027   Colonoscopy  05/20/2030   Hepatitis C Screening  Completed   HIV Screening  Completed   HPV VACCINES  Aged Out   Meningococcal B Vaccine  Aged Out      ----------------------------------------------------------------------------------------------------------------------------------------------------------------------------------------------------------------- Physical Exam BP 128/81 (BP Location: Left Arm, Patient Position: Sitting, Cuff Size: Large)   Pulse 75   Ht 6' 3 (1.905 m)   Wt 299 lb (135.6 kg)   SpO2 96%   BMI 37.37 kg/m   Physical Exam Constitutional:      Appearance: Normal appearance.  HENT:     Nose:     Comments: Pustule inside R nare, extending to the philtrum.  R sinus ttp.  Cardiovascular:     Rate and Rhythm: Normal rate and regular rhythm.  Pulmonary:     Effort: Pulmonary effort is normal.     Breath sounds: Normal breath sounds.  Neurological:     Mental Status: He is alert.  Psychiatric:        Mood and Affect: Mood normal.        Behavior: Behavior normal.     ------------------------------------------------------------------------------------------------------------------------------------------------------------------------------------------------------------------- Assessment and Plan  Acute sinusitis Treating with course of doxycycline .  Additionally, will add mupirocin  for pustule inside of nose.   Essential hypertension Blood pressure is well-controlled at this time.  Recommend continuation of current medication for management of hypertension.  Benign prostatic hyperplasia with urinary hesitancy Updated PSA.  Check urinalysis.    Meds ordered this encounter  Medications   doxycycline  (VIBRA -TABS) 100 MG tablet    Sig: Take 1 tablet (100 mg total) by mouth 2 (two) times daily for 10 days.    Dispense:  20 tablet    Refill:  0   mupirocin  ointment (BACTROBAN ) 2 %    Sig: Apply to affected area TID for 7 days.    Dispense:  22 g    Refill:  0    No follow-ups on file.

## 2023-10-30 NOTE — Patient Instructions (Signed)
 Liver specialist:   Glendia Holmes [address] [place], [address] [place] [address] [phone]

## 2023-10-31 LAB — URINALYSIS, ROUTINE W REFLEX MICROSCOPIC
Bilirubin, UA: NEGATIVE
Glucose, UA: NEGATIVE
Ketones, UA: NEGATIVE
Leukocytes,UA: NEGATIVE
Nitrite, UA: NEGATIVE
Protein,UA: NEGATIVE
RBC, UA: NEGATIVE
Specific Gravity, UA: 1.019 (ref 1.005–1.030)
Urobilinogen, Ur: 0.2 mg/dL (ref 0.2–1.0)
pH, UA: 7 (ref 5.0–7.5)

## 2023-10-31 LAB — PSA: Prostate Specific Ag, Serum: 0.5 ng/mL (ref 0.0–4.0)

## 2023-11-01 LAB — URINE CULTURE

## 2023-11-07 ENCOUNTER — Encounter: Payer: Self-pay | Admitting: Family Medicine

## 2023-11-10 ENCOUNTER — Ambulatory Visit
Admission: RE | Admit: 2023-11-10 | Discharge: 2023-11-10 | Disposition: A | Discharge status: Home / Self Care | Attending: Family Medicine | Admitting: Family Medicine

## 2023-11-10 VITALS — BP 137/93 | HR 90 | Temp 98.5°F | Resp 19

## 2023-11-10 DIAGNOSIS — K645 Perianal venous thrombosis: Secondary | ICD-10-CM

## 2023-11-10 MED ORDER — PHENYLEPHRINE IN HARD FAT 0.25 % RE SUPP: 1.0000 | Freq: Two times a day (BID) | RECTAL | 0 refills | Status: AC

## 2023-11-10 MED ORDER — HYDROCORT-PRAMOXINE (PERIANAL) 1-1 % EX FOAM: 1.0000 | Freq: Two times a day (BID) | CUTANEOUS | 0 refills | Status: AC

## 2023-11-10 NOTE — ED Triage Notes (Addendum)
 [facility] with [facility] pain for a few months. Pt reports pain is worsening. Pt was seen by William Marshall 8/21 and was not having serious issues but since then pain has worsened with difficulty urinating and feeling like he has to pee.

## 2023-11-10 NOTE — Discharge Instructions (Addendum)
 Advised patient may use suppositories and/or Proctofoam as directed.  Encouraged increase daily water intake to 64 ounces per day 7 days/week.  Encouraged patient to increase daily fiber intake to 40-80 g/day.  Advised if symptoms worsen and/or unresolved please follow-up with GI or surgical specialist.

## 2023-11-10 NOTE — ED Provider Notes (Signed)
 [facility]    CSN: [id number] Arrival date & time: 11/10/23  1414      History   Chief Complaint Chief Complaint  Patient presents with   Hemorrhoids    Entered by patient    HPI William Marshall is a 48 y.o. male.   HPI William Marshall 48 year old male presents with possible hemorrhoids.  Patient reports rectal pain for 3 months with pain worsening.  Reports was evaluated by his PCP on 10/30/2023 however was not concerned with issue then.  PMH significant for HTN, BPH with urinary hesitancy, and gout.  Past Medical History:  Diagnosis Date   GERD (gastroesophageal reflux disease)    Gout    History of osteomyelitis as a child     Patient Active Problem List   Diagnosis Date Noted   Acute sinusitis 10/30/2023   Benign prostatic hyperplasia with urinary hesitancy 10/30/2023   Dysphoric mood 09/07/2023   Essential hypertension 12/12/2022   Right flank pain 11/18/2022   Plantar fasciitis, left 08/16/2022   Acute pansinusitis 05/08/2021   Sinus drainage 01/26/2021   Injury of left lower arm 01/26/2021   Bronchitis 12/20/2020   Enlarged lymph node 01/10/2020   Rash 01/10/2020   Flank pain 11/08/2019   Podagra 03/22/2019   Tinea versicolor 08/12/2017   Allergic rhinitis 09/05/2015   Personal history of other diseases of the musculoskeletal system and connective tissue 09/05/2015   History of fracture of vertebra 09/05/2015   Transaminitis 09/05/2015    History reviewed. No pertinent surgical history.     Home Medications    Prior to Admission medications   Medication Sig Start Date End Date Taking? Authorizing Provider  hydrocortisone -pramoxine (PROCTOFOAM-HC) rectal foam Place 1 applicator rectally 2 (two) times daily for 10 days. 11/10/23 11/20/23 Yes Teddy Sharper, FNP  phenylephrine  (,USE FOR PREPARATION-H,) 0.25 % suppository Place 1 suppository rectally 2 (two) times daily. 11/10/23  Yes Teddy Sharper, FNP  allopurinol  (ZYLOPRIM ) 100 MG tablet TAKE 1 TABLET  BY MOUTH EVERY DAY 02/26/23   Alvia Bring, DO  atomoxetine  (STRATTERA ) 40 MG capsule START 40MG  DAILY X7 DAYS THEN INCREASE TO 40MG  TWICE DAILY 09/26/23   Alvia Bring, DO  Colchicine  (MITIGARE ) 0.6 MG CAPS TAKE 1 TABLET (0.6 MG TOTAL) BY MOUTH 2 (TWO) TIMES DAILY. Patient taking differently: TAKE 1 TABLET (0.6 MG TOTAL) BY MOUTH 2 (TWO) TIMES DAILY.-PRN 11/15/22   Alvia Bring, DO  fluticasone  (FLONASE ) 50 MCG/ACT nasal spray Place 1 spray into both nostrils daily. Patient taking differently: Place 1 spray into both nostrils daily. PRN 12/20/21   Gladis Leonor HERO, MD  meloxicam  (MOBIC ) 15 MG tablet Take 1 tablet (15 mg total) by mouth daily. 09/03/23   Alvia Bring, DO  montelukast  (SINGULAIR ) 10 MG tablet TAKE 1 TABLET BY MOUTH EVERYDAY AT BEDTIME 02/06/22   Alvia Bring, DO  mupirocin  ointment (BACTROBAN ) 2 % Apply to affected area TID for 7 days. 10/30/23   Alvia Bring, DO  tamsulosin  (FLOMAX ) 0.4 MG CAPS capsule TAKE 1 CAPSULE BY MOUTH EVERY DAY Patient taking differently: Take 0.4 mg by mouth daily. PRN 02/11/23   Alvia Bring, DO  valsartan -hydrochlorothiazide  (DIOVAN -HCT) 320-25 MG tablet Take 1 tablet by mouth daily. 06/05/23   Alvia Bring, DO    Family History Family History  Problem Relation Age of Onset   Hypertension Mother    Diabetes Mother    Kidney disease Maternal Grandmother     Social History Social History   Tobacco Use   Smoking status: Never  Smokeless tobacco: Never  Vaping Use   Vaping status: Never Used  Substance Use Topics   Alcohol use: Not Currently    Alcohol/week: 20.0 standard drinks of alcohol    Types: 20 Cans of beer per week   Drug use: No     Allergies   Patient has no known allergies.   Review of Systems Review of Systems  Gastrointestinal:  Positive for rectal pain.  All other systems reviewed and are negative.    Physical Exam Triage Vital Signs ED Triage Vitals  Encounter Vitals Group     BP      Girls  Systolic BP Percentile      Girls Diastolic BP Percentile      Boys Systolic BP Percentile      Boys Diastolic BP Percentile      Pulse      Resp      Temp      Temp src      SpO2      Weight      Height      Head Circumference      Peak Flow      Pain Score      Pain Loc      Pain Education      Exclude from Growth Chart    No data found.  Updated Vital Signs BP (!) 137/93   Pulse 90   Temp 98.5 F (36.9 C)   Resp 19   SpO2 98%   Visual Acuity Right Eye Distance:   Left Eye Distance:   Bilateral Distance:    Right Eye Near:   Left Eye Near:    Bilateral Near:     Physical Exam Vitals and nursing note reviewed. Exam conducted with a chaperone present.  Constitutional:      Appearance: Normal appearance. He is normal weight.  HENT:     Head: Normocephalic and atraumatic.     Mouth/Throat:     Mouth: Mucous membranes are moist.     Pharynx: Oropharynx is clear.  Eyes:     Extraocular Movements: Extraocular movements intact.     Pupils: Pupils are equal, round, and reactive to light.  Cardiovascular:     Rate and Rhythm: Normal rate and regular rhythm.     Pulses: Normal pulses.     Heart sounds: Normal heart sounds.  Pulmonary:     Effort: Pulmonary effort is normal.     Breath sounds: Normal breath sounds. No wheezing, rhonchi or rales.  Genitourinary:    Rectum: Normal.     Comments: Anal sphincter (at 6 o'clock position): ~1.5 cm ovoid shaped mildly thrombosed nonerythematous hemorrhoid noted Musculoskeletal:        General: Normal range of motion.     Cervical back: Normal range of motion and neck supple.  Skin:    General: Skin is warm and dry.  Neurological:     General: No focal deficit present.     Mental Status: He is alert and oriented to person, place, and time. Mental status is at baseline.  Psychiatric:        Mood and Affect: Mood normal.        Behavior: Behavior normal.      UC Treatments / Results  Labs (all labs ordered are  listed, but only abnormal results are displayed) Labs Reviewed - No data to display  EKG   Radiology No results found.  Procedures Procedures (including critical care time)  Medications Ordered in  UC Medications - No data to display  Initial Impression / Assessment and Plan / UC Course  I have reviewed the triage vital signs and the nursing notes.  Pertinent labs & imaging results that were available during my care of the patient were reviewed by me and considered in my medical decision making (see chart for details).     MDM: 1.  Thrombosed external hemorrhoid-Rx Proctofoam rectal foam: Place 1 applicator rectally 2 times daily for 10 days, Preparation H 0.25% suppository: Place 1 suppository rectally 2 times daily. Advised patient may use suppositories and/or Proctofoam as directed.  Encouraged increase daily water intake to 64 ounces per day 7 days/week.  Encouraged patient to increase daily fiber intake to 40-80 g/day.  Advised if symptoms worsen and/or unresolved please follow-up with GI or surgical specialist.  Patient discharged home, hemodynamically stable. Final Clinical Impressions(s) / UC Diagnoses   Final diagnoses:  Thrombosed external hemorrhoid     Discharge Instructions      Advised patient may use suppositories and/or Proctofoam as directed.  Encouraged increase daily water intake to 64 ounces per day 7 days/week.  Encouraged patient to increase daily fiber intake to 40-80 g/day.  Advised if symptoms worsen and/or unresolved please follow-up with GI or surgical specialist.     ED Prescriptions     Medication Sig Dispense Auth. Provider   hydrocortisone -pramoxine (PROCTOFOAM-HC) rectal foam Place 1 applicator rectally 2 (two) times daily for 10 days. 10 g Kaileigh Viswanathan, FNP   phenylephrine  (,USE FOR PREPARATION-H,) 0.25 % suppository Place 1 suppository rectally 2 (two) times daily. 12 suppository Erminie Foulks, FNP      PDMP not reviewed this  encounter.   Teddy Sharper, FNP 11/10/23 [phone]

## 2023-11-11 ENCOUNTER — Encounter: Payer: Self-pay | Admitting: Sports Medicine

## 2023-11-13 ENCOUNTER — Telehealth: Payer: Self-pay | Admitting: Family Medicine

## 2023-11-13 NOTE — Telephone Encounter (Signed)
 Copied from CRM #[id number]. Topic: Referral - Question >> Nov 13, 2023  8:34 AM Alfonso ORN wrote: Reason for CRM: Chander calling from  [facility] gastro  recieved a referral and need to verify if the referral need to come to us  ,reason it has  [facility] which [facility] not [facility]  and there is not a phone number to contact the patient

## 2023-11-16 ENCOUNTER — Ambulatory Visit: Payer: Self-pay | Admitting: Family Medicine

## 2023-11-17 LAB — HM SIGMOIDOSCOPY

## 2023-11-27 NOTE — Telephone Encounter (Signed)
 [facility] is closed. Referral would need to be sent to [facility] or [facility]  Patient was seen 11/14/23 by Dr. Alm Dames at [facility].

## 2023-12-04 ENCOUNTER — Ambulatory Visit: Admitting: Family Medicine

## 2023-12-04 ENCOUNTER — Encounter: Payer: Self-pay | Admitting: Family Medicine

## 2023-12-04 VITALS — BP 115/78 | HR 80 | Ht 75.0 in

## 2023-12-04 DIAGNOSIS — K602 Anal fissure, unspecified: Secondary | ICD-10-CM | POA: Insufficient documentation

## 2023-12-04 MED ORDER — SEMAGLUTIDE-WEIGHT MANAGEMENT 1 MG/0.5ML ~~LOC~~ SOAJ: 1.0000 mg | SUBCUTANEOUS | 0 refills | Status: DC

## 2023-12-04 MED ORDER — SEMAGLUTIDE-WEIGHT MANAGEMENT 0.25 MG/0.5ML ~~LOC~~ SOAJ
0.2500 mg | SUBCUTANEOUS | 0 refills | Status: AC
Start: 2023-12-04 — End: 2024-01-01

## 2023-12-04 MED ORDER — SEMAGLUTIDE-WEIGHT MANAGEMENT 0.5 MG/0.5ML ~~LOC~~ SOAJ
0.5000 mg | SUBCUTANEOUS | 0 refills | Status: AC
Start: 2024-01-02 — End: 2024-01-30

## 2023-12-04 NOTE — Assessment & Plan Note (Signed)
 He was seen by colorectal surgery as well as gastroenterology.  He did not feta pain ointment which seems to be working well for him.  We discussed maintaining good fiber intake and increase fluid.

## 2023-12-04 NOTE — Assessment & Plan Note (Signed)
 He has obesity with multiple comorbidities of hypertension, hyperlipidemia and fatty liver disease.  He has been working to lose weight on his own but has been unsuccessful.  We discussed trial of Wegovy  to assist in his weight loss.  He will follow a physician supervised weight loss plan while taking Wegovy .  He has no contraindications to Wegovy .

## 2023-12-04 NOTE — Progress Notes (Signed)
 William Marshall - 48 y.o. male MRN [id number]  Date of birth: 05-Jun-1975  Subjective Chief Complaint  Patient presents with   Weight Loss   Fatty Liver    HPI Favian Kittleson is a 48 year old male here today for follow-up.  Seen earlier this month at urgent care with complaint of rectal pain.  Diagnosed with thrombosed external hemorrhoid and referred to gastroenterology.  Gastroenterology felt that he had more of a irritated anal skin tag and was noted to have internal hemorrhoids on anoscopy.  He did undergo sigmoidoscopy which was relatively unremarkable.  He did have ultrasound of the liver due to elevated LFTs.  This was consistent with hepatic steatosis.  He was referred by GI to colorectal surgeon who noted that he had an anal fissure.  He was started on compounded nifedipine ointment and reports that he had pretty quick improvement of his pain after starting this.  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  Despite this he is continue to have difficulty with managing his weight.  He will try addition of medication to help with weight management.  ROS:  A comprehensive ROS was completed and negative except as noted per HPI  No Known Allergies  Past Medical History:  Diagnosis Date   GERD (gastroesophageal reflux disease)    Gout    History of osteomyelitis as a child     History reviewed. No pertinent surgical history.  Social History   Socioeconomic History   Marital status: Married    Spouse name: Not on file   Number of children: Not on file   Years of education: Not on file   Highest education level: Not on file  Occupational History   Not on file  Tobacco Use   Smoking status: Never   Smokeless tobacco: Never  Vaping Use   Vaping status: Never Used  Substance and Sexual Activity   Alcohol use: Not Currently    Alcohol/week: 20.0 standard drinks of alcohol    Types: 20 Cans of beer per week   Drug use: No   Sexual activity: Not on file  Other Topics  Concern   Not on file  Social History Narrative   Not on file   Social Drivers of Health   Financial Resource Strain: Low Risk  (09/03/2023)   Overall Financial Resource Strain (CARDIA)    Difficulty of Paying Living Expenses: Not hard at all  Food Insecurity: No Food Insecurity (09/03/2023)   Hunger Vital Sign    Worried About Running Out of Food in the Last Year: Never true    Ran Out of Food in the Last Year: Never true  Transportation Needs: No Transportation Needs (09/03/2023)   PRAPARE - Administrator, Civil Service (Medical): No    Lack of Transportation (Non-Medical): No  Physical Activity: Insufficiently Active (09/03/2023)   Exercise Vital Sign    Days of Exercise per Week: 3 days    Minutes of Exercise per Session: 30 min  Stress: Stress Concern Present (09/03/2023)   [employer] of Occupational Health - Occupational Stress Questionnaire    Feeling of Stress: Very much  Social Connections: Moderately Integrated (09/03/2023)   Social Connection and Isolation Panel    Frequency of Communication with Friends and Family: Never    Frequency of Social Gatherings with Friends and Family: Never    Attends Religious Services: More than 4 times per year    Active Member of Clubs or Organizations: Yes  Attends Banker Meetings: 1 to 4 times per year    Marital Status: Married    Family History  Problem Relation Age of Onset   Hypertension Mother    Diabetes Mother    Kidney disease Maternal Grandmother     Health Maintenance  Topic Date Due   Hepatitis B Vaccines 19-59 Average Risk (1 of 3 - 19+ 3-dose series) Never done   COVID-19 Vaccine (3 - 2025-26 season) 11/10/2023   Zoster Vaccines- Shingrix (1 of 2) 01/30/2024 (Originally 03/14/2023)   Influenza Vaccine  06/08/2024 (Originally 10/10/2023)   Pneumococcal Vaccine: 50+ Years (1 of 1 - PCV) 10/29/2024 (Originally 03/14/2023)   DTaP/Tdap/Td (2 - Td or Tdap) 03/18/2027   Colonoscopy  05/20/2030    Hepatitis C Screening  Completed   HIV Screening  Completed   HPV VACCINES  Aged Out   Meningococcal B Vaccine  Aged Out     ----------------------------------------------------------------------------------------------------------------------------------------------------------------------------------------------------------------- Physical Exam BP 115/78 (BP Location: Left Arm, Patient Position: Sitting, Cuff Size: Large)   Pulse 80   Ht 6' 3 (1.905 m)   BMI 37.37 kg/m   Physical Exam Eyes:     General: No scleral icterus. Cardiovascular:     Rate and Rhythm: Normal rate and regular rhythm.  Pulmonary:     Effort: Pulmonary effort is normal.     Breath sounds: Normal breath sounds.  Neurological:     General: No focal deficit present.     Mental Status: He is alert.  Psychiatric:        Mood and Affect: Mood normal.        Behavior: Behavior normal.     ------------------------------------------------------------------------------------------------------------------------------------------------------------------------------------------------------------------- Assessment and Plan  Anal fissure He was seen by colorectal surgery as well as gastroenterology.  He did not feta pain ointment which seems to be working well for him.  We discussed maintaining good fiber intake and increase fluid.  Morbid obesity (HCC) He has obesity with multiple comorbidities of hypertension, hyperlipidemia and fatty liver disease.  He has been working to lose weight on his own but has been unsuccessful.  We discussed trial of Wegovy  to assist in his weight loss.  He will follow a physician supervised weight loss plan while taking Wegovy .  He has no contraindications to Wegovy .   Meds ordered this encounter  Medications   semaglutide -weight management (WEGOVY ) 0.25 MG/0.5ML SOAJ SQ injection    Sig: Inject 0.25 mg into the skin once a week for 28 days.    Dispense:  2 mL    Refill:  0    semaglutide -weight management (WEGOVY ) 0.5 MG/0.5ML SOAJ SQ injection    Sig: Inject 0.5 mg into the skin once a week for 28 days.    Dispense:  2 mL    Refill:  0   semaglutide -weight management (WEGOVY ) 1 MG/0.5ML SOAJ SQ injection    Sig: Inject 1 mg into the skin once a week for 28 days.    Dispense:  2 mL    Refill:  0    No follow-ups on file.

## 2023-12-05 ENCOUNTER — Other Ambulatory Visit (HOSPITAL_COMMUNITY): Payer: Self-pay

## 2023-12-05 ENCOUNTER — Telehealth: Payer: Self-pay

## 2023-12-05 NOTE — Telephone Encounter (Signed)
 Pharmacy Patient Advocate Encounter   Received notification from CoverMyMeds that prior authorization for Wegovy  0.25MG /0.5ML auto-injectors is required/requested.   Insurance verification completed.   The patient is insured through [employer] .   Per test claim: PA required; PA submitted to above mentioned insurance via Latent Key/confirmation #/EOC [id number] Status is pending

## 2023-12-10 ENCOUNTER — Other Ambulatory Visit (HOSPITAL_COMMUNITY): Payer: Self-pay

## 2023-12-10 NOTE — Telephone Encounter (Signed)
 Pharmacy Patient Advocate Encounter  Received notification from [employer] that Prior Authorization for Wegovy  0.25MG /0.5ML auto-injectors  has been APPROVED from 12/05/23 to 07/06/24. Ran test claim, Copay is $24.99. This test claim was processed through [facility]- copay amounts may vary at other pharmacies due to pharmacy/plan contracts, or as the patient moves through the different stages of their insurance plan.   PA #/Case ID/Reference #: [id number]

## 2024-01-01 NOTE — Progress Notes (Signed)
 COLON AND RECTAL SURGERY CLINIC FOLLOW UP NOTE    RE: William Marshall. DOB: [birth year]. DOV: 01/01/2024.    History of Present Illness:  William Marshall is a 48 y.o. male here for follow up of anal fissure.   History of Present Illness The patient presents for evaluation of anal fissure.  He reports a significant improvement in his condition, with no current pain. He has been applying a topical cream, which he believes has contributed to the alleviation of his symptoms. He discontinued the use of the cream last week. He occasionally observes minor bleeding following bowel movements, but it is not as severe as before. He has recently started a regimen of Wegovy , which has resulted in a decreased appetite and cessation of alcohol consumption. He reports no instances of constipation.  SOCIAL HISTORY Alcohol: The patient quit drinking alcohol.   Past medical, surgical history were reviewed with changes noted below.  Medications: Current Medications[1]  Allergies: Allergies[2]   Review of Systems:  Constitutional: negative Respiratory: negative  Cardiovascular: negative Gastrointestinal: per HPI Genitourinary: negative Integumentary: negative Hematologic/lymphatic: negative Musculoskeletal: negative Neurological: negative Behavioral/Psych: negative  Physical Examination: There were no vitals taken for this visit. Constitutional: well developed, well nourished in no acute distress Eyes: sclera non-icteric, EOMI Respiratory: normal effort Cardiovascular: regular rate and rhythm  Gastrointestinal: soft, non-tender, non-distended; no organomegaly, masses, or hernia  Musculoskeletal: full range of motion without pain, no edema    The following laboratory studies, imaging studies and reports were reviewed:   None  ASSESSMENT/PLAN: William Marshall is a 48 y.o. male with history of anal fissure treated with conservative management.  He has had  resolution of his symptoms and feels back to his normal.  He has started Wegovy  recently but otherwise maintains healthy bowel habits.  I encouraged him to reach back out to us  if he has any recurrence of symptoms or other questions or concerns.   Computer technology was used to create visit note.  Consent from patient/caregiver was obtained prior to its use.  Documentation for time-based billing:  Total time spent of date of service was 20 minutes.  Patient care activities included preparing to see the patient such as reviewing the patient record, obtaining and/or reviewing separately obtained history, performing a medically appropriate history and physical examination, counseling and educating the patient, family, and/or caregiver, ordering prescription medications, tests, or procedures, referring and communicating with other health care providers when not separately reported during the visit, documenting clinical information in the electronic or other health record, independently interpreting results when not separately reported, and communicating results to the patient/family/caregiver.   Charlie FORBES Margarette Mickey, MD Colorectal Surgery [facility] Ph. [phone]  8:34 AM 01/01/2024       [1] Current Outpatient Medications  Medication Sig Dispense Refill  . albuterol  sulfate (PROVENTIL ) 2.5 mg/3 mL nebulizer solution Take 2.5 mg by nebulization every 6 (six) hours as needed for Wheezing. (Patient not taking: Reported on 01/01/2024)    . allopurinol  (ZYLOPRIM ) 100 mg tablet Take one tablet (100 mg dose) by mouth daily.    . atomoxetine  (STRATTERA ) 40 mg capsule START 40MG  DAILY X7 DAYS THEN INCREASE TO 40MG  TWICE DAILY    . fluticasone  propionate (FLONASE ) 50 mcg/actuation nasal spray by Nasal route.    . Lidocaine-Hydrocortisone  Ace 3-2.5 % KIT rectal gel Apply 2 times daily for 2 weeks 28 each 0  . loratadine (CLARITIN) 10 MG tablet Take one tablet (10  mg  dose) by mouth daily.    . meloxicam  (MOBIC ) 15 mg tablet Take one tablet (15 mg dose) by mouth daily.    . montelukast  (SINGULAIR ) 10 MG tablet Take one tablet (10 mg dose) by mouth at bedtime.    . nifedipine 0.2% in hydrocortisone  1% Apply one Application topically 3 (three) times a day. Use as directed to anus. Dispense one tube. 30 g 0  . ranitidine (ZANTAC) 150 MG capsule Take one capsule (150 mg dose) by mouth 2 (two) times daily.    . tamsulosin  (FLOMAX ) 0.4 mg CAPS Take one capsule (0.4 mg dose) by mouth daily. Take one tablet by mouth once a day, 30 minutes after a meal. 30 capsule 3  . valsartan -hydrochlorothiazide  (DIOVAN -HCT) 320-25 mg per tablet Take one tablet by mouth daily.     No current facility-administered medications for this visit.  [2] No Known Allergies

## 2024-01-03 ENCOUNTER — Other Ambulatory Visit: Payer: Self-pay | Admitting: Family Medicine

## 2024-01-07 ENCOUNTER — Telehealth: Payer: Self-pay

## 2024-01-07 NOTE — Telephone Encounter (Signed)
 Spoke with pharmacy.  They did have the prescription and are getting it ready for patient for pick up .

## 2024-01-07 NOTE — Telephone Encounter (Signed)
 Copied from CRM [id number]. Topic: Clinical - Prescription Issue >> Jan 07, 2024  2:14 PM William Marshall wrote: Pt calling to find out more info on refill refusal-Semaglutide -Weight Management

## 2024-02-08 ENCOUNTER — Other Ambulatory Visit: Payer: Self-pay | Admitting: Family Medicine

## 2024-02-25 ENCOUNTER — Other Ambulatory Visit: Payer: Self-pay | Admitting: Family Medicine

## 2024-02-25 DIAGNOSIS — M109 Gout, unspecified: Secondary | ICD-10-CM

## 2024-03-06 ENCOUNTER — Other Ambulatory Visit: Payer: Self-pay | Admitting: Family Medicine

## 2024-03-09 ENCOUNTER — Telehealth: Payer: Self-pay

## 2024-03-09 NOTE — Telephone Encounter (Signed)
 This has been forwarded to you in another encounter. I did speak with the patient who states he has been on the 1 mg wegovy  and just finished his last dose last fri. Please advise because it isn't showing on his med list.

## 2024-03-09 NOTE — Telephone Encounter (Signed)
 Copied from CRM [id number]. Topic: Clinical - Medication Question >> Mar 09, 2024 11:08 AM Tonda B wrote: Reason for CRM: pt wife is calling in has questions about pt rx Semaglutide -Weight Management 1 MG/0.5ML please call pt back [phone]

## 2024-03-18 ENCOUNTER — Encounter: Payer: Self-pay | Admitting: Family Medicine

## 2024-03-18 ENCOUNTER — Ambulatory Visit: Admitting: Family Medicine

## 2024-03-18 ENCOUNTER — Other Ambulatory Visit: Payer: Self-pay | Admitting: Family Medicine

## 2024-03-18 DIAGNOSIS — I10 Essential (primary) hypertension: Secondary | ICD-10-CM | POA: Diagnosis not present

## 2024-03-18 MED ORDER — WEGOVY 1.7 MG/0.75ML ~~LOC~~ SOAJ: 1.7000 mg | SUBCUTANEOUS | 1 refills | Status: AC

## 2024-03-18 NOTE — Assessment & Plan Note (Signed)
 He has obesity with multiple comorbidities of hypertension, hyperlipidemia and fatty liver disease.  He has been working to lose weight on his own but has been unsuccessful with conventional measures.  He has done quite well with Wegovy  and I recommend that he continue for weight loss and the sake of his overall health.  Will titrate to 1.7mg  as he is tolerating well.  F/u in 3 months.

## 2024-03-18 NOTE — Assessment & Plan Note (Signed)
 BP elevated however having hypotension at previous strength.  He will try 1/2 tab and monitor BP at home.

## 2024-03-18 NOTE — Progress Notes (Signed)
 " William Marshall - 49 y.o. male MRN [id number]  Date of birth: Jun 13, 1975  Subjective Chief Complaint  Patient presents with   Weight Loss    HPI William Marshall is a 49 y.o. here today for follow up visit.   He has been on Wegovy  for the past few month.  Doing well overall with this and weight is down about 30lbs.  He is tolerating this quite well.  He has been out of medication over the past  couple of weeks due to pharmacy not having the 1mg  strength in stock.    He did stop his BP medication due to hypotension symptoms and low bp reading at home. His BP is elevated today off medications.  Denies chest pain, shortness of breath, palpitations, headache or vision changes.   ROS:  A comprehensive ROS was completed and negative except as noted per HPI  Allergies[1]  Past Medical History:  Diagnosis Date   GERD (gastroesophageal reflux disease)    Gout    History of osteomyelitis as a child     History reviewed. No pertinent surgical history.  Social History   Socioeconomic History   Marital status: Married    Spouse name: Not on file   Number of children: Not on file   Years of education: Not on file   Highest education level: Not on file  Occupational History   Not on file  Tobacco Use   Smoking status: Never   Smokeless tobacco: Never  Vaping Use   Vaping status: Never Used  Substance and Sexual Activity   Alcohol use: Not Currently    Alcohol/week: 20.0 standard drinks of alcohol    Types: 20 Cans of beer per week   Drug use: No   Sexual activity: Not on file  Other Topics Concern   Not on file  Social History Narrative   Not on file   Social Drivers of Health   Tobacco Use: Low Risk (03/18/2024)   Patient History    Smoking Tobacco Use: Never    Smokeless Tobacco Use: Never    Passive Exposure: Not on file  Financial Resource Strain: Low Risk (09/03/2023)   Overall Financial Resource Strain (CARDIA)    Difficulty of Paying Living Expenses: Not hard at all  Food  Insecurity: No Food Insecurity (09/03/2023)   Epic    Worried About Radiation Protection Practitioner of Food in the Last Year: Never true    Ran Out of Food in the Last Year: Never true  Transportation Needs: No Transportation Needs (09/03/2023)   Epic    Lack of Transportation (Medical): No    Lack of Transportation (Non-Medical): No  Physical Activity: Insufficiently Active (09/03/2023)   Exercise Vital Sign    Days of Exercise per Week: 3 days    Minutes of Exercise per Session: 30 min  Stress: Stress Concern Present (09/03/2023)   [employer] of Occupational Health - Occupational Stress Questionnaire    Feeling of Stress: Very much  Social Connections: Moderately Integrated (09/03/2023)   Social Connection and Isolation Panel    Frequency of Communication with Friends and Family: Never    Frequency of Social Gatherings with Friends and Family: Never    Attends Religious Services: More than 4 times per year    Active Member of Clubs or Organizations: Yes    Attends Banker Meetings: 1 to 4 times per year    Marital Status: Married  Depression (PHQ2-9): High Risk (10/30/2023)   Depression (PHQ2-9)  PHQ-2 Score: 14  Alcohol Screen: Low Risk (09/03/2023)   Alcohol Screen    Last Alcohol Screening Score (AUDIT): 0  Housing: Low Risk (09/03/2023)   Epic    Unable to Pay for Housing in the Last Year: No    Number of Times Moved in the Last Year: 0    Homeless in the Last Year: No  Utilities: Not At Risk (09/03/2023)   Epic    Threatened with loss of utilities: No  Health Literacy: Adequate Health Literacy (09/03/2023)   B1300 Health Literacy    Frequency of need for help with medical instructions: Rarely    Family History  Problem Relation Age of Onset   Hypertension Mother    Diabetes Mother    Kidney disease Maternal Grandmother     Health Maintenance  Topic Date Due   Hepatitis B Vaccines 19-59 Average Risk (1 of 3 - 19+ 3-dose series) Never done   Zoster Vaccines- Shingrix  (1 of 2) Never done   Influenza Vaccine  06/08/2024 (Originally 10/10/2023)   Pneumococcal Vaccine: 50+ Years (1 of 1 - PCV) 10/29/2024 (Originally 03/14/2023)   COVID-19 Vaccine (3 - 2025-26 season) 11/08/2024 (Originally 11/10/2023)   DTaP/Tdap/Td (2 - Td or Tdap) 03/18/2027   Colonoscopy  05/20/2030   Hepatitis C Screening  Completed   HIV Screening  Completed   HPV VACCINES  Aged Out   Meningococcal B Vaccine  Aged Out     ----------------------------------------------------------------------------------------------------------------------------------------------------------------------------------------------------------------- Physical Exam BP (!) 152/96 (BP Location: Left Arm, Patient Position: Sitting, Cuff Size: Large)   Pulse 73   Ht 6' 3 (1.905 m)   Wt 270 lb (122.5 kg)   SpO2 97%   BMI 33.75 kg/m   Physical Exam Constitutional:      Appearance: Normal appearance.  HENT:     Head: Normocephalic and atraumatic.  Cardiovascular:     Rate and Rhythm: Normal rate and regular rhythm.  Pulmonary:     Effort: Pulmonary effort is normal.     Breath sounds: Normal breath sounds.  Musculoskeletal:     Cervical back: Neck supple.  Neurological:     General: No focal deficit present.     Mental Status: He is alert.  Psychiatric:        Mood and Affect: Mood normal.        Behavior: Behavior normal.     ------------------------------------------------------------------------------------------------------------------------------------------------------------------------------------------------------------------- Assessment and Plan  Morbid obesity (HCC) He has obesity with multiple comorbidities of hypertension, hyperlipidemia and fatty liver disease.  He has been working to lose weight on his own but has been unsuccessful with conventional measures.  He has done quite well with Wegovy  and I recommend that he continue for weight loss and the sake of his overall health.  Will  titrate to 1.7mg  as he is tolerating well.  F/u in 3 months.   Essential hypertension BP elevated however having hypotension at previous strength.  He will try 1/2 tab and monitor BP at home.    Meds ordered this encounter  Medications   semaglutide -weight management (WEGOVY ) 1.7 MG/0.75ML SOAJ SQ injection    Sig: Inject 1.7 mg into the skin once a week.    Dispense:  9 mL    Refill:  1    Return in about 3 months (around 06/16/2024) for weight management.         [1] No Known Allergies  "

## 2024-03-24 ENCOUNTER — Encounter: Payer: Self-pay | Admitting: Family Medicine

## 2024-03-29 NOTE — Telephone Encounter (Signed)
 Per [facility] pharmacy - a prior authorization is required for Wegovy . Thanks in advance.

## 2024-03-31 ENCOUNTER — Telehealth: Payer: Self-pay

## 2024-03-31 ENCOUNTER — Other Ambulatory Visit (HOSPITAL_COMMUNITY): Payer: Self-pay

## 2024-03-31 NOTE — Telephone Encounter (Signed)
 Noted. This task has been completed. Please review the other TE. The patient was notified of the update via a MyChart message. Aware that enrollment in [facility] is required and that the medication will come from [facility].

## 2024-03-31 NOTE — Telephone Encounter (Signed)
 Pharmacy Patient Advocate Encounter   Received notification from RX Request Messages that prior authorization for Wegovy  1.7mg /0.38ml is required/requested.   Insurance verification completed.   The patient is insured through [employer].   Per test claim: PA started via CoverMyMeds, Key: [id number]. PA cancelled due to drug not covered by plan, may be excluded from the patient's benefit.     Placed a call to the insurance and per the representative, the patient must enroll with [facility] at [phone]. She stated the patient will be able to get the medication through them.

## 2024-04-06 ENCOUNTER — Other Ambulatory Visit (HOSPITAL_COMMUNITY): Payer: Self-pay

## 2024-06-17 ENCOUNTER — Ambulatory Visit: Admitting: Family Medicine
